# Patient Record
Sex: Female | Born: 1975 | Race: White | Hispanic: No | Marital: Married | Smoking: Never smoker
Health system: Southern US, Community
[De-identification: ages and names within clinical notes are randomized; demographics above are authoritative.]

## PROBLEM LIST (undated history)

## (undated) DIAGNOSIS — I4891 Unspecified atrial fibrillation: Secondary | ICD-10-CM

## (undated) DIAGNOSIS — F419 Anxiety disorder, unspecified: Secondary | ICD-10-CM

## (undated) DIAGNOSIS — G43909 Migraine, unspecified, not intractable, without status migrainosus: Secondary | ICD-10-CM

## (undated) DIAGNOSIS — N12 Tubulo-interstitial nephritis, not specified as acute or chronic: Secondary | ICD-10-CM

## (undated) DIAGNOSIS — F32A Depression, unspecified: Secondary | ICD-10-CM

## (undated) DIAGNOSIS — N289 Disorder of kidney and ureter, unspecified: Secondary | ICD-10-CM

## (undated) HISTORY — PX: ABDOMINAL HYSTERECTOMY: SHX81

## (undated) HISTORY — DX: Depression, unspecified: F32.A

## (undated) HISTORY — PX: BLADDER SUSPENSION: SHX72

## (undated) HISTORY — DX: Anxiety disorder, unspecified: F41.9

## (undated) HISTORY — PX: TONSILLECTOMY: SUR1361

---

## 2004-04-17 ENCOUNTER — Emergency Department: Payer: Self-pay | Admitting: Emergency Medicine

## 2004-07-03 ENCOUNTER — Emergency Department: Payer: Self-pay | Admitting: Internal Medicine

## 2005-02-23 ENCOUNTER — Emergency Department: Payer: Self-pay | Admitting: Emergency Medicine

## 2006-02-27 ENCOUNTER — Emergency Department: Payer: Self-pay | Admitting: Emergency Medicine

## 2006-09-10 ENCOUNTER — Emergency Department: Payer: Self-pay | Admitting: Emergency Medicine

## 2007-03-02 ENCOUNTER — Emergency Department: Payer: Self-pay | Admitting: Emergency Medicine

## 2007-06-14 ENCOUNTER — Emergency Department: Payer: Self-pay | Admitting: Emergency Medicine

## 2007-07-26 ENCOUNTER — Other Ambulatory Visit: Payer: Self-pay

## 2007-07-26 ENCOUNTER — Emergency Department: Payer: Self-pay | Admitting: Internal Medicine

## 2009-09-15 ENCOUNTER — Emergency Department: Payer: Self-pay | Admitting: Emergency Medicine

## 2011-01-11 ENCOUNTER — Emergency Department: Payer: Self-pay | Admitting: Unknown Physician Specialty

## 2011-08-28 ENCOUNTER — Emergency Department: Payer: Self-pay | Admitting: Emergency Medicine

## 2011-08-28 LAB — COMPREHENSIVE METABOLIC PANEL
Albumin: 3.7 g/dL (ref 3.4–5.0)
Anion Gap: 6 — ABNORMAL LOW (ref 7–16)
BUN: 8 mg/dL (ref 7–18)
Bilirubin,Total: 0.6 mg/dL (ref 0.2–1.0)
Calcium, Total: 8.9 mg/dL (ref 8.5–10.1)
Chloride: 106 mmol/L (ref 98–107)
Co2: 29 mmol/L (ref 21–32)
EGFR (African American): >60
Glucose: 90 mg/dL (ref 65–99)
Osmolality: 279 (ref 275–301)
Potassium: 4 mmol/L (ref 3.5–5.1)
SGOT(AST): 25 U/L (ref 15–37)
Sodium: 141 mmol/L (ref 136–145)
Total Protein: 7.2 g/dL (ref 6.4–8.2)

## 2011-08-28 LAB — URINALYSIS, COMPLETE
Bilirubin,UR: NEGATIVE
Glucose,UR: NEGATIVE mg/dL (ref 0–75)
Ph: 6 (ref 4.5–8.0)
Protein: NEGATIVE
RBC,UR: NONE SEEN /HPF (ref 0–5)
Specific Gravity: 1.011 (ref 1.003–1.030)
Squamous Epithelial: 7
WBC UR: 1 /HPF (ref 0–5)

## 2011-08-28 LAB — CBC
HCT: 44.1 % (ref 35.0–47.0)
HGB: 15.1 g/dL (ref 12.0–16.0)
MCH: 30 pg (ref 26.0–34.0)
MCHC: 34.1 g/dL (ref 32.0–36.0)
MCV: 88 fL (ref 80–100)
RBC: 5.01 10*6/uL (ref 3.80–5.20)
RDW: 12.6 % (ref 11.5–14.5)

## 2011-08-28 LAB — WET PREP, GENITAL

## 2011-10-24 ENCOUNTER — Emergency Department: Payer: Self-pay | Admitting: Emergency Medicine

## 2012-02-23 ENCOUNTER — Emergency Department: Payer: Self-pay | Admitting: Emergency Medicine

## 2012-02-23 LAB — CBC
HGB: 14.7 g/dL (ref 12.0–16.0)
MCHC: 35.5 g/dL (ref 32.0–36.0)
MCV: 86 fL (ref 80–100)
RBC: 4.83 10*6/uL (ref 3.80–5.20)
RDW: 12.6 % (ref 11.5–14.5)

## 2012-02-23 LAB — COMPREHENSIVE METABOLIC PANEL
Alkaline Phosphatase: 106 U/L (ref 50–136)
Anion Gap: 8 (ref 7–16)
BUN: 7 mg/dL (ref 7–18)
Calcium, Total: 8.6 mg/dL (ref 8.5–10.1)
Chloride: 110 mmol/L — ABNORMAL HIGH (ref 98–107)
EGFR (African American): >60
SGOT(AST): 19 U/L (ref 15–37)
SGPT (ALT): 38 U/L (ref 12–78)
Sodium: 141 mmol/L (ref 136–145)
Total Protein: 7 g/dL (ref 6.4–8.2)

## 2012-02-23 LAB — CK TOTAL AND CKMB (NOT AT ARMC): CK, Total: 25 U/L (ref 21–215)

## 2012-02-24 LAB — TROPONIN I: Troponin-I: <0.02 ng/mL

## 2012-02-24 LAB — TSH: Thyroid Stimulating Horm: 1.8 u[IU]/mL

## 2012-02-24 LAB — CK TOTAL AND CKMB (NOT AT ARMC): CK-MB: <0.5 ng/mL — ABNORMAL LOW (ref 0.5–3.6)

## 2012-05-30 ENCOUNTER — Emergency Department: Payer: Self-pay | Admitting: Emergency Medicine

## 2012-09-10 ENCOUNTER — Emergency Department: Payer: Self-pay | Admitting: Emergency Medicine

## 2012-11-06 ENCOUNTER — Emergency Department: Payer: Self-pay | Admitting: Emergency Medicine

## 2012-11-06 LAB — CBC
HCT: 44.5 % (ref 35.0–47.0)
HGB: 15.5 g/dL (ref 12.0–16.0)
MCH: 30.3 pg (ref 26.0–34.0)
MCHC: 34.9 g/dL (ref 32.0–36.0)
MCV: 87 fL (ref 80–100)
Platelet: 245 10*3/uL (ref 150–440)
RDW: 12.3 % (ref 11.5–14.5)

## 2012-11-06 LAB — BASIC METABOLIC PANEL
Anion Gap: 5 — ABNORMAL LOW (ref 7–16)
BUN: 10 mg/dL (ref 7–18)
Calcium, Total: 9.2 mg/dL (ref 8.5–10.1)
Chloride: 106 mmol/L (ref 98–107)
Co2: 27 mmol/L (ref 21–32)
Creatinine: 0.82 mg/dL (ref 0.60–1.30)
EGFR (African American): >60
EGFR (Non-African Amer.): >60
Glucose: 85 mg/dL (ref 65–99)
Osmolality: 274 (ref 275–301)
Potassium: 4.1 mmol/L (ref 3.5–5.1)
Sodium: 138 mmol/L (ref 136–145)

## 2012-11-06 LAB — CK TOTAL AND CKMB (NOT AT ARMC): CK, Total: 39 U/L (ref 21–215)

## 2012-11-06 LAB — TROPONIN I: Troponin-I: <0.02 ng/mL

## 2012-11-09 ENCOUNTER — Emergency Department: Payer: Self-pay | Admitting: Emergency Medicine

## 2013-08-24 ENCOUNTER — Emergency Department: Payer: Self-pay | Admitting: Emergency Medicine

## 2013-09-30 ENCOUNTER — Emergency Department: Payer: Self-pay | Admitting: Emergency Medicine

## 2013-09-30 LAB — BASIC METABOLIC PANEL
Anion Gap: 10 (ref 7–16)
BUN: 11 mg/dL (ref 7–18)
Calcium, Total: 8.5 mg/dL (ref 8.5–10.1)
Chloride: 110 mmol/L — ABNORMAL HIGH (ref 98–107)
Co2: 23 mmol/L (ref 21–32)
Creatinine: 0.84 mg/dL (ref 0.60–1.30)
GLUCOSE: 94 mg/dL (ref 65–99)
OSMOLALITY: 284 (ref 275–301)
Potassium: 3.4 mmol/L — ABNORMAL LOW (ref 3.5–5.1)
SODIUM: 143 mmol/L (ref 136–145)

## 2013-09-30 LAB — CBC
HCT: 41.9 % (ref 35.0–47.0)
HGB: 14.4 g/dL (ref 12.0–16.0)
MCH: 30.2 pg (ref 26.0–34.0)
MCHC: 34.4 g/dL (ref 32.0–36.0)
MCV: 88 fL (ref 80–100)
Platelet: 257 10*3/uL (ref 150–440)
RBC: 4.78 10*6/uL (ref 3.80–5.20)
RDW: 11.8 % (ref 11.5–14.5)
WBC: 10.2 10*3/uL (ref 3.6–11.0)

## 2013-09-30 LAB — TROPONIN I: Troponin-I: <0.02 ng/mL

## 2013-12-16 ENCOUNTER — Emergency Department: Payer: Self-pay | Admitting: Internal Medicine

## 2014-04-06 ENCOUNTER — Emergency Department: Payer: Self-pay | Admitting: Emergency Medicine

## 2014-06-16 ENCOUNTER — Emergency Department: Admit: 2014-06-16 | Discharge status: Against Medical Advice | Payer: Self-pay | Admitting: Emergency Medicine

## 2014-09-14 IMAGING — CR DG CHEST 2V
1 series · 2 of 2 positions shown · non-contrast
Comparison: Chest radiograph performed 02/23/2012

CLINICAL DATA: Left-sided chest pain, radiating to the left arm.
Shortness of breath.

EXAM:
CHEST  2 VIEW

[Series 1: w chest pa · 0.14mm/px · 2 of 2 slices shown]
[im 1/2]
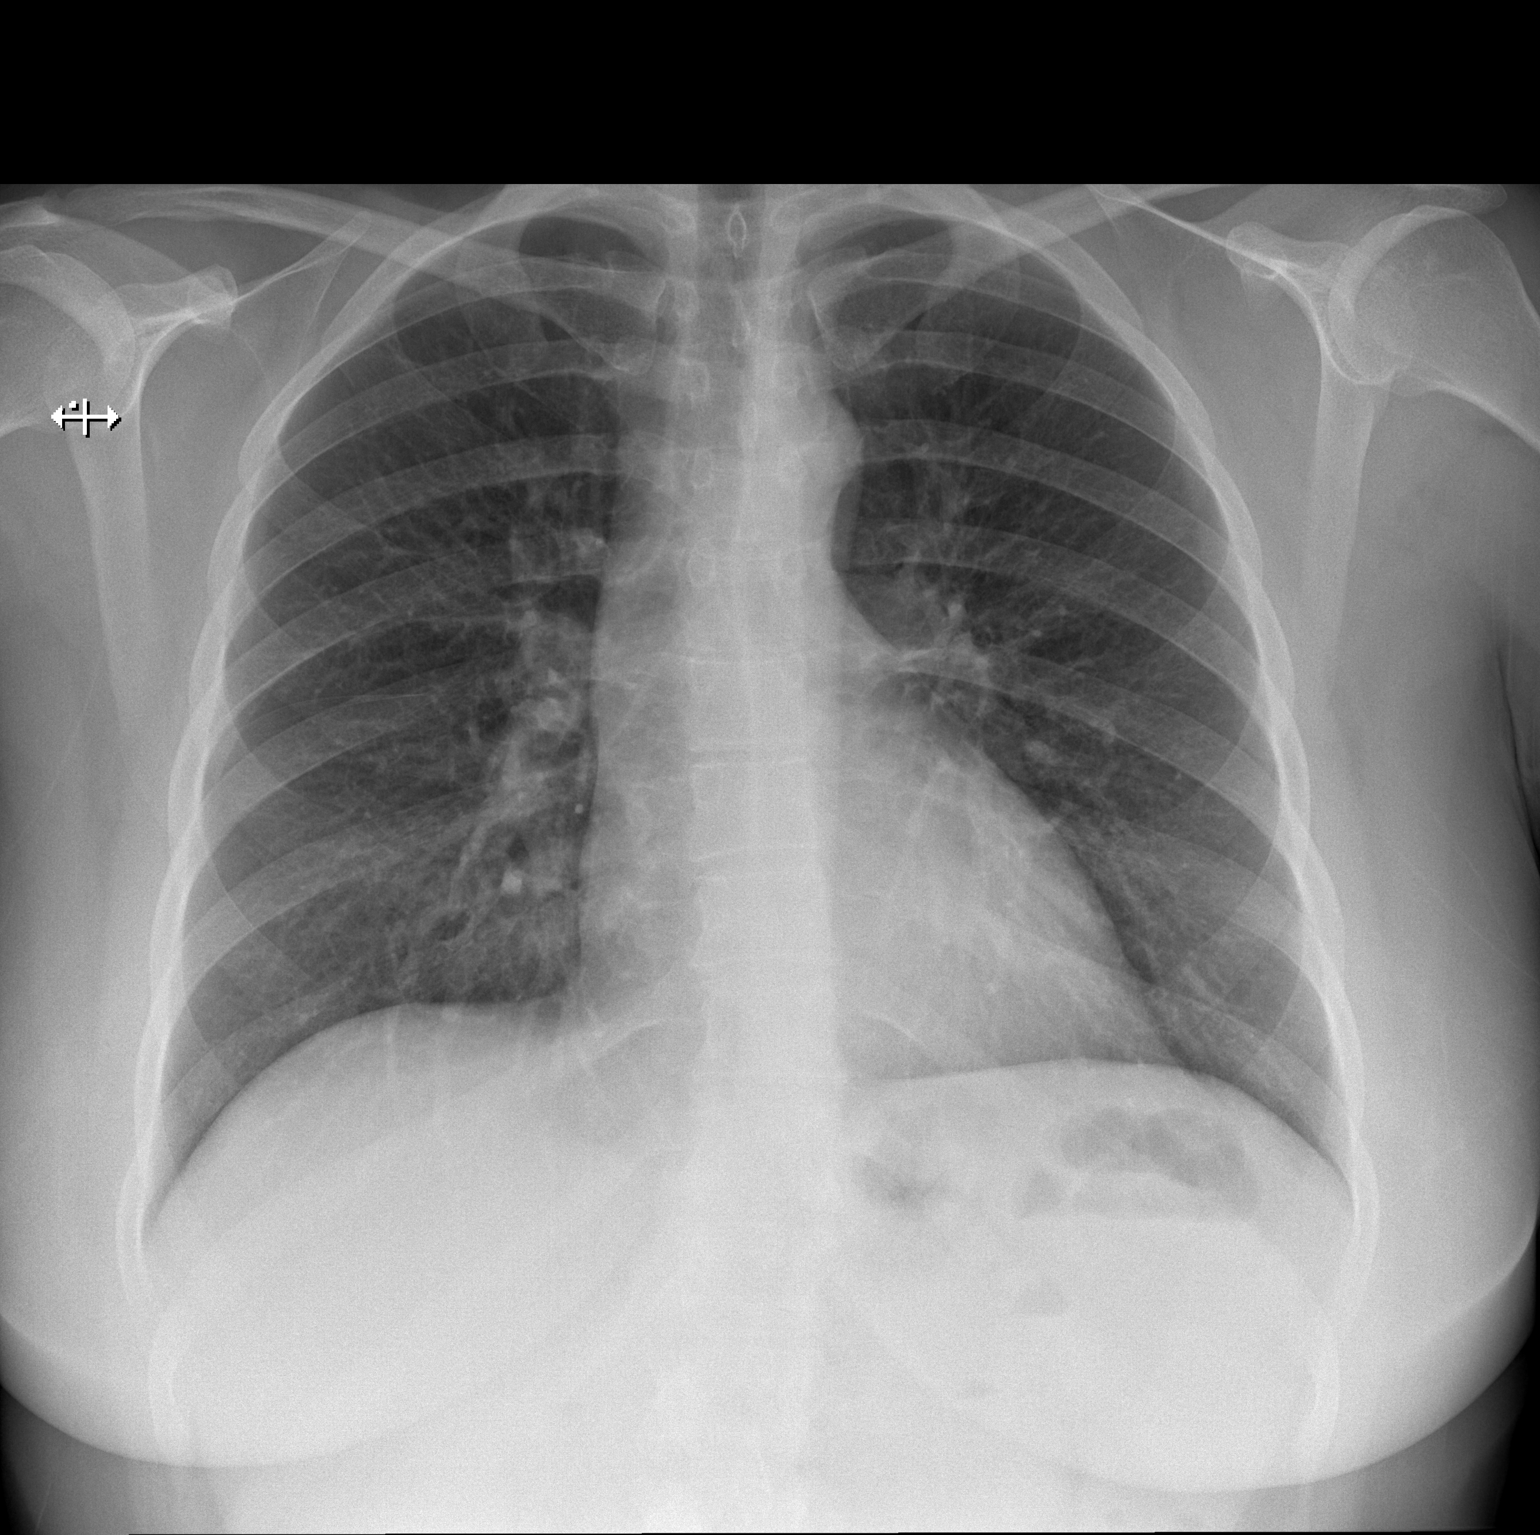
[im 2/2]
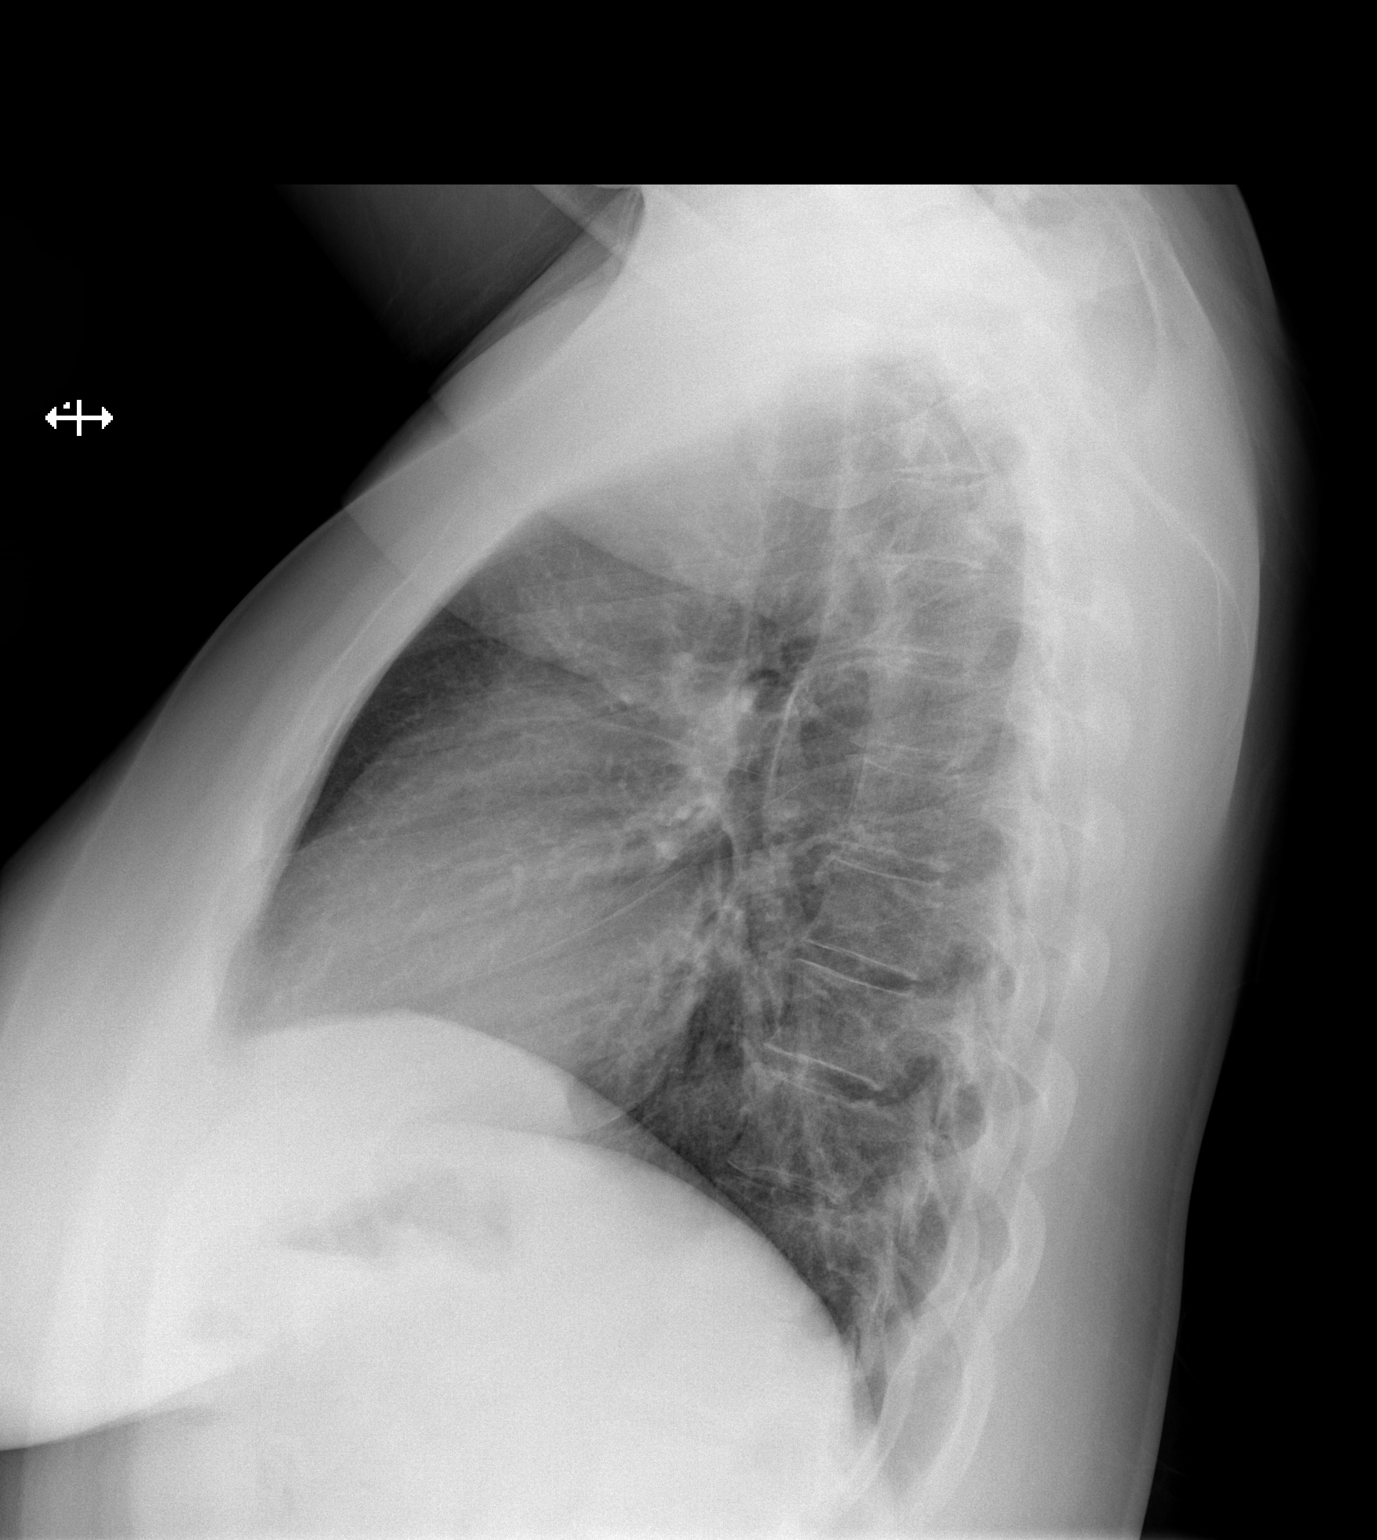

[2 of 2 positions shown; findings below may reference images not displayed]

FINDINGS: The lungs are well-aerated and clear. There is no evidence of focal
opacification, pleural effusion or pneumothorax.

The heart is normal in size; the mediastinal contour is within
normal limits. No acute osseous abnormalities are seen.
IMPRESSION: No acute cardiopulmonary process seen.

## 2015-02-26 ENCOUNTER — Other Ambulatory Visit: Payer: Self-pay

## 2015-02-26 ENCOUNTER — Emergency Department
Admission: EM | Admit: 2015-02-26 | Discharge: 2015-02-26 | Disposition: A | Discharge status: Home / Self Care | Payer: Self-pay | Attending: Emergency Medicine | Admitting: Emergency Medicine

## 2015-02-26 ENCOUNTER — Emergency Department: Payer: Self-pay

## 2015-02-26 ENCOUNTER — Encounter: Payer: Self-pay | Admitting: Emergency Medicine

## 2015-02-26 DIAGNOSIS — R079 Chest pain, unspecified: Secondary | ICD-10-CM | POA: Insufficient documentation

## 2015-02-26 DIAGNOSIS — R42 Dizziness and giddiness: Secondary | ICD-10-CM | POA: Insufficient documentation

## 2015-02-26 DIAGNOSIS — R030 Elevated blood-pressure reading, without diagnosis of hypertension: Secondary | ICD-10-CM | POA: Insufficient documentation

## 2015-02-26 DIAGNOSIS — R51 Headache: Secondary | ICD-10-CM | POA: Insufficient documentation

## 2015-02-26 LAB — CBC
HCT: 42.1 % (ref 35.0–47.0)
Hemoglobin: 14 g/dL (ref 12.0–16.0)
MCH: 29.4 pg (ref 26.0–34.0)
MCHC: 33.1 g/dL (ref 32.0–36.0)
MCV: 88.7 fL (ref 80.0–100.0)
PLATELETS: 282 10*3/uL (ref 150–440)
RBC: 4.75 MIL/uL (ref 3.80–5.20)
RDW: 12.6 % (ref 11.5–14.5)
WBC: 8.5 10*3/uL (ref 3.6–11.0)

## 2015-02-26 LAB — BASIC METABOLIC PANEL
Anion gap: 6 (ref 5–15)
BUN: 13 mg/dL (ref 6–20)
CALCIUM: 9 mg/dL (ref 8.9–10.3)
CHLORIDE: 113 mmol/L — AB (ref 101–111)
CO2: 19 mmol/L — AB (ref 22–32)
Creatinine, Ser: 0.81 mg/dL (ref 0.44–1.00)
GFR calc Af Amer: >60 mL/min (ref >60)
GFR calc non Af Amer: >60 mL/min (ref >60)
Glucose, Bld: 120 mg/dL — ABNORMAL HIGH (ref 65–99)
Potassium: 4.2 mmol/L (ref 3.5–5.1)
Sodium: 138 mmol/L (ref 135–145)

## 2015-02-26 LAB — URINALYSIS COMPLETE WITH MICROSCOPIC (ARMC ONLY)
Bilirubin Urine: NEGATIVE
Glucose, UA: NEGATIVE mg/dL
Hgb urine dipstick: NEGATIVE
KETONES UR: NEGATIVE mg/dL
Leukocytes, UA: NEGATIVE
Nitrite: NEGATIVE
PH: 5 (ref 5.0–8.0)
PROTEIN: NEGATIVE mg/dL
Specific Gravity, Urine: 1.021 (ref 1.005–1.030)

## 2015-02-26 LAB — TROPONIN I: Troponin I: <0.03 ng/mL (ref <0.031)

## 2015-02-26 MED ORDER — ATENOLOL 25 MG PO TABS
25.0000 mg | ORAL_TABLET | Freq: Once | ORAL | Status: AC
  Administered 2015-02-26: 25 mg via ORAL
  Filled 2015-02-26 (×2): qty 1

## 2015-02-26 MED ORDER — ASPIRIN 81 MG PO CHEW
324.0000 mg | CHEWABLE_TABLET | Freq: Once | ORAL | Status: AC
  Administered 2015-02-26: 324 mg via ORAL
  Filled 2015-02-26: qty 4
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
## 2015-02-26 NOTE — Discharge Instructions (Signed)
Follow-up with Dr. Stark Klein - call tomorrow morning for an appointment tomorrow or on Friday. Return to the emergency department if you have further chest pain, if you have shortness of breath, or if you have other urgent concerns.  Nonspecific Chest Pain It is often hard to find the cause of chest pain. There is always a chance that your pain could be related to something serious, such as a heart attack or a blood clot in your lungs. Chest pain can also be caused by conditions that are not life-threatening. If you have chest pain, it is very important to follow up with your doctor.  HOME CARE  If you were prescribed an antibiotic medicine, finish it all even if you start to feel better.  Avoid any activities that cause chest pain.  Do not use any tobacco products, including cigarettes, chewing tobacco, or electronic cigarettes. If you need help quitting, ask your doctor.  Do not drink alcohol.  Take medicines only as told by your doctor.  Keep all follow-up visits as told by your doctor. This is important. This includes any further testing if your chest pain does not go away.  Your doctor may tell you to keep your head raised (elevated) while you sleep.  Make lifestyle changes as told by your doctor. These may include:  Getting regular exercise. Ask your doctor to suggest some activities that are safe for you.  Eating a heart-healthy diet. Your doctor or a diet specialist (dietitian) can help you to learn healthy eating options.  Maintaining a healthy weight.  Managing diabetes, if necessary.  Reducing stress. GET HELP IF:  Your chest pain does not go away, even after treatment.  You have a rash with blisters on your chest.  You have a fever. GET HELP RIGHT AWAY IF:  Your chest pain is worse.  You have an increasing cough, or you cough up blood.  You have severe belly (abdominal) pain.  You feel extremely weak.  You pass out (faint).  You have chills.  You  have sudden, unexplained chest discomfort.  You have sudden, unexplained discomfort in your arms, back, neck, or jaw.  You have shortness of breath at any time.  You suddenly start to sweat, or your skin gets clammy.  You feel nauseous.  You vomit.  You suddenly feel light-headed or dizzy.  Your heart begins to beat quickly, or it feels like it is skipping beats. These symptoms may be an emergency. Do not wait to see if the symptoms will go away. Get medical help right away. Call your local emergency services (911 in the [place] Do not drive yourself to the hospital.   This information is not intended to replace advice given to you by your health care provider. Make sure you discuss any questions you have with your health care provider.   Document Released: 07/28/2007 Document Revised: 03/01/2014 Document Reviewed: 09/14/2013 Elsevier Interactive Patient Education [employer].

## 2015-02-26 NOTE — ED Notes (Signed)
Pt presents with some dizziness and chest pain started this am when she woke up, got worse when she got to work.

## 2015-02-26 NOTE — ED Notes (Signed)
Says she has been having some chest pain on and off.  Today had tingling sensation in left arm.  Left work and went to [facility] to get bp and pulse checked.  160/100 hr 116.

## 2015-12-10 ENCOUNTER — Encounter: Payer: Self-pay | Admitting: *Deleted

## 2015-12-10 ENCOUNTER — Ambulatory Visit: Payer: Self-pay | Attending: Oncology | Admitting: *Deleted

## 2015-12-10 ENCOUNTER — Encounter (INDEPENDENT_AMBULATORY_CARE_PROVIDER_SITE_OTHER): Payer: Self-pay

## 2015-12-10 ENCOUNTER — Ambulatory Visit
Admission: RE | Admit: 2015-12-10 | Discharge: 2015-12-10 | Disposition: A | Discharge status: Home / Self Care | Payer: Self-pay | Source: Ambulatory Visit | Attending: Oncology | Admitting: Oncology

## 2015-12-10 VITALS — BP 116/82 | HR 94 | Temp 98.5°F | Ht 60.63 in | Wt 181.0 lb

## 2015-12-10 DIAGNOSIS — N6459 Other signs and symptoms in breast: Secondary | ICD-10-CM

## 2015-12-10 NOTE — Patient Instructions (Signed)
Gave patient hand-out, Women Staying Healthy, Active and Well from [place], with education on breast health, pap smears, heart and colon health.

## 2015-12-10 NOTE — Progress Notes (Signed)
Subjective:     Patient ID: Allison Sherman, female   DOB: 1976-02-21, 40 y.o.   MRN: [id number]  HPI   Review of Systems     Objective:   Physical Exam  Pulmonary/Chest: Right breast exhibits nipple discharge. Right breast exhibits no inverted nipple, no mass, no skin change and no tenderness. Left breast exhibits mass and nipple discharge. Left breast exhibits no inverted nipple, no skin change and no tenderness. Breasts are symmetrical.    Patient states she has had milky bilateral nipple discharge on manipulation only for over 15 years       Assessment:     40 year old White female presents to [facility] with complaints of a left breast mass for over a year.  States the area has doubled in size over the last year and remains tender.  Also states she has a 15 plus year history of white nipple discharge on manipulation only.  On clinical breast exam I can palpate with the assistance the patient a tender mobile approximate 3X4 cm  thickening from 6-9:00 left medial breast near the sternum.  No nipple discharge noted on exam.  Taught self breast awareness.  Patient has been screened for eligibility.  She does not have any insurance, Medicare or Medicaid.  She also meets financial eligibility.  Hand-out given on the Affordable Care Act.    Plan:     Will get bilateral diagnostic mammogram and ultrasound.  Will follow-up per [facility] protocol.

## 2015-12-16 ENCOUNTER — Telehealth: Payer: Self-pay | Admitting: *Deleted

## 2015-12-16 ENCOUNTER — Encounter: Payer: Self-pay | Admitting: General Surgery

## 2015-12-16 NOTE — Telephone Encounter (Signed)
Called patient and discussed her results of her mammogram.  Offered a surgical consult.  Patient is scheduled to see Dr. Jamal Collin on 12/25/15 at 2:15.  She is to arrive 30 minutes early to complete paperwork.  She is to take a photo ID and all her meds with her to the appointment.  Will follow up per [facility] protocol.

## 2015-12-24 ENCOUNTER — Encounter: Payer: Self-pay | Admitting: *Deleted

## 2015-12-25 ENCOUNTER — Ambulatory Visit: Payer: Self-pay | Admitting: General Surgery

## 2016-01-06 ENCOUNTER — Telehealth: Payer: Self-pay | Admitting: *Deleted
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
## 2016-01-29 ENCOUNTER — Telehealth: Payer: Self-pay | Admitting: *Deleted

## 2016-01-29 ENCOUNTER — Encounter: Payer: Self-pay | Admitting: *Deleted

## 2016-01-29 NOTE — Telephone Encounter (Signed)
Attempted to call patient, but she has no voicemail available to leave a message.  She has not rescheduled her appointment with Dr. Jamal Collin.  Mailed letter to offer to reschedule.  If no response will close case as refusal to follow-up.

## 2016-02-05 ENCOUNTER — Encounter: Payer: Self-pay | Admitting: *Deleted

## 2016-02-05 NOTE — Progress Notes (Signed)
Patient has never rescheduled her appointment for surgical consult.  Will close case as refusal to follow-up.  HSIS to [place].

## 2016-02-19 ENCOUNTER — Encounter: Payer: Self-pay | Admitting: *Deleted

## 2016-08-21 ENCOUNTER — Emergency Department: Payer: Self-pay

## 2016-08-21 ENCOUNTER — Encounter: Payer: Self-pay | Admitting: Emergency Medicine

## 2016-08-21 ENCOUNTER — Emergency Department
Admission: EM | Admit: 2016-08-21 | Discharge: 2016-08-21 | Disposition: A | Discharge status: Home / Self Care | Payer: Self-pay | Attending: Emergency Medicine | Admitting: Emergency Medicine

## 2016-08-21 DIAGNOSIS — K529 Noninfective gastroenteritis and colitis, unspecified: Secondary | ICD-10-CM | POA: Insufficient documentation

## 2016-08-21 HISTORY — DX: Disorder of kidney and ureter, unspecified: N28.9

## 2016-08-21 HISTORY — DX: Unspecified atrial fibrillation: I48.91

## 2016-08-21 LAB — URINALYSIS, COMPLETE (UACMP) WITH MICROSCOPIC
Bilirubin Urine: NEGATIVE
GLUCOSE, UA: NEGATIVE mg/dL
HGB URINE DIPSTICK: NEGATIVE
KETONES UR: NEGATIVE mg/dL
LEUKOCYTES UA: NEGATIVE
NITRITE: NEGATIVE
PROTEIN: NEGATIVE mg/dL
Specific Gravity, Urine: 1.013 (ref 1.005–1.030)
pH: 7 (ref 5.0–8.0)

## 2016-08-21 LAB — COMPREHENSIVE METABOLIC PANEL
ALT: 36 U/L (ref 14–54)
AST: 29 U/L (ref 15–41)
Albumin: 3.6 g/dL (ref 3.5–5.0)
Alkaline Phosphatase: 79 U/L (ref 38–126)
Anion gap: 6 (ref 5–15)
BUN: 12 mg/dL (ref 6–20)
CHLORIDE: 108 mmol/L (ref 101–111)
CO2: 23 mmol/L (ref 22–32)
Calcium: 9.1 mg/dL (ref 8.9–10.3)
Creatinine, Ser: 0.68 mg/dL (ref 0.44–1.00)
Glucose, Bld: 93 mg/dL (ref 65–99)
POTASSIUM: 3.6 mmol/L (ref 3.5–5.1)
Sodium: 137 mmol/L (ref 135–145)
Total Bilirubin: 0.6 mg/dL (ref 0.3–1.2)
Total Protein: 6.9 g/dL (ref 6.5–8.1)

## 2016-08-21 LAB — CBC
HCT: 42.2 % (ref 35.0–47.0)
HEMOGLOBIN: 14.6 g/dL (ref 12.0–16.0)
MCH: 29.6 pg (ref 26.0–34.0)
MCHC: 34.7 g/dL (ref 32.0–36.0)
MCV: 85.5 fL (ref 80.0–100.0)
Platelets: 292 10*3/uL (ref 150–440)
RBC: 4.94 MIL/uL (ref 3.80–5.20)
RDW: 12.5 % (ref 11.5–14.5)
WBC: 15.5 10*3/uL — AB (ref 3.6–11.0)

## 2016-08-21 LAB — LIPASE, BLOOD: LIPASE: 31 U/L (ref 11–51)

## 2016-08-21 LAB — POCT PREGNANCY, URINE: Preg Test, Ur: NEGATIVE

## 2016-08-21 MED ORDER — CIPROFLOXACIN HCL 500 MG PO TABS
500.0000 mg | ORAL_TABLET | Freq: Once | ORAL | Status: AC
  Administered 2016-08-21: 500 mg via ORAL
  Filled 2016-08-21: qty 1

## 2016-08-21 MED ORDER — ONDANSETRON HCL 4 MG/2ML IJ SOLN
4.0000 mg | Freq: Once | INTRAMUSCULAR | Status: AC
  Administered 2016-08-21: 4 mg via INTRAVENOUS
  Filled 2016-08-21: qty 2

## 2016-08-21 MED ORDER — METRONIDAZOLE 500 MG PO TABS: 500.0000 mg | ORAL_TABLET | Freq: Three times a day (TID) | ORAL | 0 refills | Status: AC

## 2016-08-21 MED ORDER — DICYCLOMINE HCL 20 MG PO TABS: 20.0000 mg | ORAL_TABLET | Freq: Three times a day (TID) | ORAL | 0 refills | Status: DC | PRN

## 2016-08-21 MED ORDER — MORPHINE SULFATE (PF) 2 MG/ML IV SOLN
2.0000 mg | Freq: Once | INTRAVENOUS | Status: AC
  Administered 2016-08-21: 2 mg via INTRAVENOUS
  Filled 2016-08-21: qty 1

## 2016-08-21 MED ORDER — IOPAMIDOL (ISOVUE-300) INJECTION 61%
100.0000 mL | Freq: Once | INTRAVENOUS | Status: AC | PRN
  Administered 2016-08-21: 100 mL via INTRAVENOUS

## 2016-08-21 MED ORDER — SODIUM CHLORIDE 0.9 % IV BOLUS (SEPSIS)
1000.0000 mL | Freq: Once | INTRAVENOUS | Status: AC
  Administered 2016-08-21: 1000 mL via INTRAVENOUS

## 2016-08-21 MED ORDER — CIPROFLOXACIN HCL 500 MG PO TABS: 500.0000 mg | ORAL_TABLET | Freq: Two times a day (BID) | ORAL | 0 refills | Status: AC

## 2016-08-21 MED ORDER — IOPAMIDOL (ISOVUE-300) INJECTION 61%
30.0000 mL | Freq: Once | INTRAVENOUS | Status: AC | PRN
  Administered 2016-08-21: 30 mL via ORAL

## 2016-08-21 MED ORDER — METRONIDAZOLE 500 MG PO TABS
500.0000 mg | ORAL_TABLET | Freq: Once | ORAL | Status: AC
  Administered 2016-08-21: 500 mg via ORAL
  Filled 2016-08-21: qty 1

## 2016-08-21 NOTE — ED Provider Notes (Signed)
[facility] Emergency Department Provider Note  ____________________________________________   First MD Initiated Contact with Patient 08/21/16 1614     (approximate)  I have reviewed the triage vital signs and the nursing notes.   HISTORY  Chief Complaint Abdominal Pain   HPI Allison Sherman is a 41 y.o. female who is presenting to the emergency department today with left lower quadrant abdominal pain. She says that she has had intermittent pain over the past week but the pain has gotten sharp at this time and cramping and more severe. Says the pain is an 8 out of 10. She says the pain is now diffuse and crampy to the abdomen but worse the left lower quadrant. She has also reporting 8 episodes of diarrhea just today. Does not report any blood in her stool. Reports nausea but no vomiting. 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 No history of diverticulitis. History of a partial hysterectomy.No recent travel or antibiotics.   Past Medical History:  Diagnosis Date  . A-fib ([place])   . Renal disorder    kidney failure at 12    There are no active problems to display for this patient.   Past Surgical History:  Procedure Laterality Date  . ABDOMINAL HYSTERECTOMY     partial  . BLADDER SUSPENSION    . TONSILLECTOMY      Prior to Admission medications   Not on File    Allergies Patient has no known allergies.  Family History  Problem Relation Age of Onset  . Breast cancer Paternal Aunt        20's    Social History Social History  Substance Use Topics  . Smoking status: Never Smoker  . Smokeless tobacco: Never Used  . Alcohol use Yes     Comment: occasionally    Review of Systems  Constitutional: No fever/chills Eyes: No visual changes. ENT: No sore throat. Cardiovascular: Denies chest pain. Respiratory: Denies shortness of breath. Gastrointestinal:  no vomiting.  No  constipation. Genitourinary: Negative for dysuria. Musculoskeletal: Negative for back pain. Skin: Negative for rash. Neurological: Negative for headaches, focal weakness or numbness.   ____________________________________________   PHYSICAL EXAM:  VITAL SIGNS: ED Triage Vitals  Enc Vitals Group     BP 08/21/16 1429 127/84     Pulse Rate 08/21/16 1429 (!) 109     Resp 08/21/16 1429 20     Temp 08/21/16 1429 98.9 F (37.2 C)     Temp Source 08/21/16 1429 Oral     SpO2 08/21/16 1429 97 %     Weight 08/21/16 1427 190 lb (86.2 kg)     Height 08/21/16 1427 5' (1.524 m)     Head Circumference --      Peak Flow --      Pain Score 08/21/16 1428 8     Pain Loc --      Pain Edu? --      Excl. in [place]? --     Constitutional: Alert and oriented. Well appearing and in no acute distress. Eyes: Conjunctivae are normal.  Head: Atraumatic. Nose: No congestion/rhinnorhea. Mouth/Throat: Mucous membranes are moist.  Neck: No stridor.   Cardiovascular: Normal rate, regular rhythm. Grossly normal heart sounds.   Respiratory: Normal respiratory effort.  No retractions. Lungs CTAB. Gastrointestinal: Soft With mild to moderate tenderness to left lower quadrant. No rebound or guarding. When I palpate the lower quadrant the patient says that  the pain radiates to the left lower quadrant. No distention.  Musculoskeletal: No lower extremity tenderness nor edema.  No joint effusions. Neurologic:  Normal speech and language. No gross focal neurologic deficits are appreciated. Skin:  Skin is warm, dry and intact. No rash noted. Psychiatric: Mood and affect are normal. Speech and behavior are normal.  ____________________________________________   LABS (all labs ordered are listed, but only abnormal results are displayed)  Labs Reviewed  CBC - Abnormal; Notable for the following:       Result Value   WBC 15.5 (*)    All other components within normal limits  URINALYSIS, COMPLETE (UACMP) WITH  MICROSCOPIC - Abnormal; Notable for the following:    Color, Urine YELLOW (*)    APPearance CLEAR (*)    Bacteria, UA RARE (*)    Squamous Epithelial / LPF 0-5 (*)    All other components within normal limits  GASTROINTESTINAL PANEL BY PCR, STOOL (REPLACES STOOL CULTURE)  C DIFFICILE QUICK SCREEN W PCR REFLEX  LIPASE, BLOOD  COMPREHENSIVE METABOLIC PANEL  POCT PREGNANCY, URINE   ____________________________________________  EKG   ____________________________________________  RADIOLOGY  CT of the abdomen without any evidence of acute abnormality within the solid abdominal organs. Diffuse because of thickening of the cecum and ascending colon part of the transverse colon likely infectious or inflammatory given the long segment of colon involvement. ____________________________________________   PROCEDURES  Procedure(s) performed:   Angiocath insertion Performed by: Doran Stabler  Consent: Verbal consent obtained. Risks and benefits: risks, benefits and alternatives were discussed Time out: Immediately prior to procedure a "time out" was called to verify the correct patient, procedure, equipment, support staff and site/side marked as required.  Preparation: Patient was prepped and draped in the usual sterile fashion.  Vein Location: Right basilic  Ultrasound Guided  Gauge: 18  Normal blood return and flush without difficulty Patient tolerance: Patient tolerated the procedure well with no immediate complications.     Procedures  Critical Care performed:   ____________________________________________   INITIAL IMPRESSION / ASSESSMENT AND PLAN / ED COURSE  Pertinent labs & imaging results that were available during my care of the patient were reviewed by me and considered in my medical decision making (see chart for details).  ----------------------------------------- 7:46 PM on 08/21/2016 -----------------------------------------  Patient without  any episodes of diarrhea in the emergency department. However, possible inflammatory or infectious etiology of the colon found on CAT scan. Patient says that she has not eaten anything and this is when her diarrhea usually starts. I will prescribe her Cipro and Flagyl for presumed colitis as well as Bentyl for abdominal cramping. She'll follow up as an outpatient. She is understanding the plan and willing to comply. We reviewed the labs as well as the imaging. She knows to return to the hospital for any worsening or concerning symptoms.      ____________________________________________   FINAL CLINICAL IMPRESSION(S) / ED DIAGNOSES  Colitis.    NEW MEDICATIONS STARTED DURING THIS VISIT:  New Prescriptions   No medications on file     Note:  This document was prepared using Dragon voice recognition software and may include unintentional dictation errors.     Orbie Pyo, MD 08/21/16 [id number]

## 2016-08-21 NOTE — ED Notes (Addendum)
Patient reports 8 bowel movements today prior to arrival. Patient states they are not watery just loose and soft. Denies seeing any blood in stool. 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 Denies being around anyone with same symptoms.

## 2016-08-21 NOTE — ED Triage Notes (Signed)
Pt presents to ED via POV c/o LLQ 8/10 pain x1 week, worsening the last 2 days. +diarrhea, denies vomiting. Pt states pain is sharp/shooting and has intermittent "contraction-type" spasms.

## 2017-04-06 ENCOUNTER — Other Ambulatory Visit: Payer: Self-pay

## 2017-04-06 ENCOUNTER — Encounter: Payer: Self-pay | Admitting: Emergency Medicine

## 2017-04-06 ENCOUNTER — Emergency Department
Admission: EM | Admit: 2017-04-06 | Discharge: 2017-04-06 | Disposition: A | Discharge status: Home / Self Care | Payer: Self-pay | Attending: Emergency Medicine | Admitting: Emergency Medicine

## 2017-04-06 DIAGNOSIS — H10022 Other mucopurulent conjunctivitis, left eye: Secondary | ICD-10-CM | POA: Insufficient documentation

## 2017-04-06 DIAGNOSIS — H1032 Unspecified acute conjunctivitis, left eye: Secondary | ICD-10-CM

## 2017-04-06 MED ORDER — TOBRAMYCIN 0.3 % OP SOLN: 2.0000 [drp] | OPHTHALMIC | 0 refills | Status: DC

## 2017-04-06 NOTE — Discharge Instructions (Signed)
Follow-up with your regular doctor if you are not better in 3 days.  Use medication as prescribed.  If you are worsening please return to the emergency department.  Wash your hands after touching her eye.

## 2017-04-06 NOTE — ED Triage Notes (Signed)
Pt with left eye redness and drainage with itching.

## 2017-04-06 NOTE — ED Provider Notes (Signed)
[facility] Emergency Department Provider Note  ____________________________________________   First MD Initiated Contact with Patient 04/06/17 [phone]     (approximate)  I have reviewed the triage vital signs and the nursing notes.   HISTORY  Chief Complaint Eye Drainage    HPI Allison Sherman is a 42 y.o. female presents emergency department complaining of left eye redness and drainage.  She states her eyes feel irritated last night when she rinsed it with water.  She woke up this morning with some crusting.  Since then she has had drainage from the eye.  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  She denies fever or chills.  She denies cough or congestion  Past Medical History:  Diagnosis Date  . A-fib ([place])   . Renal disorder    kidney failure at 12    There are no active problems to display for this patient.   Past Surgical History:  Procedure Laterality Date  . ABDOMINAL HYSTERECTOMY     partial  . BLADDER SUSPENSION    . TONSILLECTOMY      Prior to Admission medications   Medication Sig Start Date End Date Taking? Authorizing Provider  dicyclomine (BENTYL) 20 MG tablet Take 1 tablet (20 mg total) by mouth 3 (three) times daily as needed for spasms. 08/21/16 08/21/17  Orbie Pyo, MD  tobramycin (TOBREX) 0.3 % ophthalmic solution Place 2 drops into the left eye every 4 (four) hours. 04/06/17   Versie Starks, PA-C    Allergies Patient has no known allergies.  Family History  Problem Relation Age of Onset  . Breast cancer Paternal Aunt        20's    Social History Social History   Tobacco Use  . Smoking status: Never Smoker  . Smokeless tobacco: Never Used  Substance Use Topics  . Alcohol use: Yes    Comment: occasionally  . Drug use: No    Review of Systems  Constitutional: No fever/chills Eyes: No visual changes.  Positive for redness and drainage ENT: No sore throat. Respiratory: Denies  cough Genitourinary: Negative for dysuria. Musculoskeletal: Negative for back pain. Skin: Negative for rash.    ____________________________________________   PHYSICAL EXAM:  VITAL SIGNS: ED Triage Vitals  Enc Vitals Group     BP 04/06/17 0737 (!) 138/103     Pulse Rate 04/06/17 0735 (!) 103     Resp 04/06/17 0735 20     Temp 04/06/17 0735 98.2 F (36.8 C)     Temp Source 04/06/17 0735 Oral     SpO2 04/06/17 0735 100 %     Weight 04/06/17 0735 190 lb (86.2 kg)     Height --      Head Circumference --      Peak Flow --      Pain Score --      Pain Loc --      Pain Edu? --      Excl. in [place]? --     Constitutional: Alert and oriented. Well appearing and in no acute distress. Eyes: Conjunctiva of the left eye is red, there is active drainage, there is no matting at this time. perrl Head: Atraumatic. Nose: No congestion/rhinnorhea. Mouth/Throat: Mucous membranes are moist.  Throat is normal Neck: Neck is supple no lymphadenopathy is noted Cardiovascular: Normal rate, regular rhythm. Respiratory: Normal respiratory effort.  No retractions, lungs clear to auscultation GU: deferred Musculoskeletal: FROM all extremities, warm and well  perfused Neurologic:  Normal speech and language.  Skin:  Skin is warm, dry and intact. No rash noted. Psychiatric: Mood and affect are normal. Speech and behavior are normal.  ____________________________________________   LABS (all labs ordered are listed, but only abnormal results are displayed)  Labs Reviewed - No data to display ____________________________________________   ____________________________________________  RADIOLOGY    ____________________________________________   PROCEDURES  Procedure(s) performed: No  Procedures    ____________________________________________   INITIAL IMPRESSION / ASSESSMENT AND PLAN / ED COURSE  Pertinent labs & imaging results that were available during my care of the patient  were reviewed by me and considered in my medical decision making (see chart for details).  Patient is 42 year old female complaining of left eye redness and drainage  On physical exam the left eye is injected with some drainage  Diagnosis is acute conjunctivitis.  A prescription for tobramycin ophthalmic drops was provided.  Patient is to apply warm compress as needed.  She is to wash her hands anytime she touches her eye.  She can return to work after she has had 24 hours of medication.  She is to return to the emergency department if she is worsening.  She is to see her regular doctor if she is not better in 3-5 days.  Patient states she understands.  She was given a work note and discharged in stable condition     As part of my medical decision making, I reviewed the following data within the [place] notes reviewed and incorporated, Old chart reviewed, Notes from prior ED visits and [place] Controlled Substance Database  ____________________________________________   FINAL CLINICAL IMPRESSION(S) / ED DIAGNOSES  Final diagnoses:  Acute bacterial conjunctivitis of left eye      NEW MEDICATIONS STARTED DURING THIS VISIT:  New Prescriptions   TOBRAMYCIN (TOBREX) 0.3 % OPHTHALMIC SOLUTION    Place 2 drops into the left eye every 4 (four) hours.     Note:  This document was prepared using Dragon voice recognition software and may include unintentional dictation errors.    Versie Starks, PA-C 04/06/17 [place], [place], MD 04/07/17 [phone]

## 2017-04-06 NOTE — ED Notes (Signed)
FIRST NURSE NOTE: Pt to ER via POV, ambulatory c/o left eye redness and drainage. Pt alert and oriented X4, active, cooperative, pt in NAD. RR even and unlabored, color WNL.

## 2017-05-01 ENCOUNTER — Emergency Department
Admission: EM | Admit: 2017-05-01 | Discharge: 2017-05-01 | Disposition: A | Discharge status: Home / Self Care | Payer: Self-pay | Attending: Emergency Medicine | Admitting: Emergency Medicine

## 2017-05-01 DIAGNOSIS — Z79899 Other long term (current) drug therapy: Secondary | ICD-10-CM | POA: Insufficient documentation

## 2017-05-01 DIAGNOSIS — L03011 Cellulitis of right finger: Secondary | ICD-10-CM | POA: Insufficient documentation

## 2017-05-01 MED ORDER — CEPHALEXIN 500 MG PO CAPS
500.0000 mg | ORAL_CAPSULE | Freq: Four times a day (QID) | ORAL | 0 refills | Status: AC
Start: 2017-05-01 — End: 2017-05-11

## 2017-05-01 NOTE — ED Provider Notes (Signed)
[facility] Emergency Department Provider Note  ____________________________________________  Time seen: Approximately 9:50 PM  I have reviewed the triage vital signs and the nursing notes.   HISTORY  Chief Complaint Hand Pain    HPI Allison Sherman is a 42 y.o. female that presents to the emergency department for evaluation of redness and swelling to nail bed of right index finger for 1 week.  Patient  tried to puncture area with a needle without drainage.  She does not bite her nails are pink her cuticles.  No fever, chills.  Past Medical History:  Diagnosis Date  . A-fib ([place])   . Renal disorder    kidney failure at 12    There are no active problems to display for this patient.   Past Surgical History:  Procedure Laterality Date  . ABDOMINAL HYSTERECTOMY     partial  . BLADDER SUSPENSION    . TONSILLECTOMY      Prior to Admission medications   Medication Sig Start Date End Date Taking? Authorizing Provider  cephALEXin (KEFLEX) 500 MG capsule Take 1 capsule (500 mg total) by mouth 4 (four) times daily for 10 days. 05/01/17 05/11/17  Laban Emperor, PA-C  dicyclomine (BENTYL) 20 MG tablet Take 1 tablet (20 mg total) by mouth 3 (three) times daily as needed for spasms. 08/21/16 08/21/17  Orbie Pyo, MD  tobramycin (TOBREX) 0.3 % ophthalmic solution Place 2 drops into the left eye every 4 (four) hours. 04/06/17   Versie Starks, PA-C    Allergies Patient has no known allergies.  Family History  Problem Relation Age of Onset  . Breast cancer Paternal Aunt        20's    Social History Social History   Tobacco Use  . Smoking status: Never Smoker  . Smokeless tobacco: Never Used  Substance Use Topics  . Alcohol use: Yes    Comment: occasionally  . Drug use: No     Review of Systems  Constitutional: No fever/chills Gastrointestinal: No nausea, no vomiting.  Musculoskeletal: Positive for finger pain. Skin: Negative for  abrasions, lacerations, ecchymosis.  Positive for rash.   ____________________________________________   PHYSICAL EXAM:  VITAL SIGNS: ED Triage Vitals [05/01/17 1938]  Enc Vitals Group     BP (!) 125/94     Pulse Rate 98     Resp 18     Temp 98.7 F (37.1 C)     Temp Source Oral     SpO2 100 %     Weight 190 lb (86.2 kg)     Height 5' (1.524 m)     Head Circumference      Peak Flow      Pain Score 5     Pain Loc      Pain Edu?      Excl. in [place]?      Constitutional: Alert and oriented. Well appearing and in no acute distress. Eyes: Conjunctivae are normal. PERRL. EOMI. Head: Atraumatic. ENT:      Ears:      Nose: No congestion/rhinnorhea.      Mouth/Throat: Mucous membranes are moist.  Neck: No stridor.   Cardiovascular: Normal rate, regular rhythm.  Good peripheral circulation. Respiratory: Normal respiratory effort without tachypnea or retractions. Lungs CTAB. Good air entry to the bases with no decreased or absent breath sounds. Musculoskeletal: Full range of motion to all extremities. No gross deformities appreciated. Neurologic:  Normal speech and language. No gross focal neurologic deficits are  appreciated.  Skin:  Skin is warm, dry and intact.  Redness to right index finger nailbed.  No drainable area noted.  No erythema or tenderness to palpation over finger pad.   ____________________________________________   LABS (all labs ordered are listed, but only abnormal results are displayed)  Labs Reviewed - No data to display ____________________________________________  EKG   ____________________________________________  RADIOLOGY   No results found.  ____________________________________________    PROCEDURES  Procedure(s) performed:    Procedures    Medications - No data to display   ____________________________________________   INITIAL IMPRESSION / ASSESSMENT AND PLAN / ED COURSE  Pertinent labs & imaging results that were  available during my care of the patient were reviewed by me and considered in my medical decision making (see chart for details).  Review of the [place] CSRS was performed in accordance of the [place] prior to dispensing any controlled drugs.   Patient's diagnosis is consistent with paronychia.  Vital signs and exam are reassuring. No drainable abscess.  Patient will be discharged home with prescriptions for keflex. Patient is to follow up with PCP as directed. Patient is given ED precautions to return to the ED for any worsening or new symptoms.     ____________________________________________  FINAL CLINICAL IMPRESSION(S) / ED DIAGNOSES  Final diagnoses:  Paronychia of finger of right hand      NEW MEDICATIONS STARTED DURING THIS VISIT:  ED Discharge Orders        Ordered    cephALEXin (KEFLEX) 500 MG capsule  4 times daily     05/01/17 2153          This chart was dictated using voice recognition software/Dragon. Despite best efforts to proofread, errors can occur which can change the meaning. Any change was purely unintentional.    Laban Emperor, PA-C 05/01/17 [place], [place], MD 05/05/17 1459

## 2017-05-01 NOTE — ED Triage Notes (Addendum)
Patient c/o 2nd digit pain/swelling/redness proximal to nailbed., right hand

## 2017-05-01 NOTE — ED Notes (Signed)
Pt c/o right index finger pain at the fingernail, red, swollen, no drainage. Scab present from where patient used a needle to try to drain it herself. ROM WNL.

## 2017-06-08 ENCOUNTER — Other Ambulatory Visit: Payer: Self-pay

## 2017-06-08 ENCOUNTER — Emergency Department
Admission: EM | Admit: 2017-06-08 | Discharge: 2017-06-08 | Disposition: A | Discharge status: Home / Self Care | Payer: Self-pay | Attending: Emergency Medicine | Admitting: Emergency Medicine

## 2017-06-08 DIAGNOSIS — Z79899 Other long term (current) drug therapy: Secondary | ICD-10-CM | POA: Insufficient documentation

## 2017-06-08 DIAGNOSIS — G43809 Other migraine, not intractable, without status migrainosus: Secondary | ICD-10-CM | POA: Insufficient documentation

## 2017-06-08 MED ORDER — DIPHENHYDRAMINE HCL 50 MG/ML IJ SOLN
50.0000 mg | Freq: Once | INTRAMUSCULAR | Status: AC
  Administered 2017-06-08: 50 mg via INTRAVENOUS

## 2017-06-08 MED ORDER — SODIUM CHLORIDE 0.9 % IV BOLUS
1000.0000 mL | Freq: Once | INTRAVENOUS | Status: AC
  Administered 2017-06-08: 1000 mL via INTRAVENOUS

## 2017-06-08 MED ORDER — KETOROLAC TROMETHAMINE 30 MG/ML IJ SOLN
INTRAMUSCULAR | Status: AC
  Administered 2017-06-08: 30 mg via INTRAVENOUS
  Filled 2017-06-08: qty 1

## 2017-06-08 MED ORDER — METOCLOPRAMIDE HCL 5 MG/ML IJ SOLN
INTRAMUSCULAR | Status: AC
  Administered 2017-06-08: 10 mg via INTRAVENOUS
  Filled 2017-06-08: qty 2

## 2017-06-08 MED ORDER — KETOROLAC TROMETHAMINE 30 MG/ML IJ SOLN
30.0000 mg | Freq: Once | INTRAMUSCULAR | Status: AC
  Administered 2017-06-08: 30 mg via INTRAVENOUS

## 2017-06-08 MED ORDER — METOCLOPRAMIDE HCL 5 MG/ML IJ SOLN
10.0000 mg | Freq: Once | INTRAMUSCULAR | Status: AC
  Administered 2017-06-08: 10 mg via INTRAVENOUS

## 2017-06-08 MED ORDER — DIPHENHYDRAMINE HCL 50 MG/ML IJ SOLN
INTRAMUSCULAR | Status: AC
  Administered 2017-06-08: 50 mg via INTRAVENOUS
  Filled 2017-06-08: qty 1

## 2017-06-08 NOTE — ED Triage Notes (Signed)
FIRST NURSE NOTE-here for migraine. Hx of same. This one X 3 days. excedrin not helping. Ambulatory. NAD

## 2017-06-08 NOTE — ED Provider Notes (Signed)
[facility] Emergency Department Provider Note  Time seen: 10:20 PM  I have reviewed the triage vital signs and the nursing notes.   HISTORY  Chief Complaint Migraine    HPI Allison Sherman is a 42 y.o. female with a past medical history of migraine headaches, presents to the emergency department for headache.  According to the patient she typically gets 2 or 3 migraines per month.  She states for the past 3 days she has had a fairly constant headache.  States last time she had to go to the emergency department for headache was years ago but will on occasion have to.  Denies any weakness or numbness confusion or slurred speech.  Denies any fever.  She does state photo and phonophobia nausea with one episode of vomiting.  Has tried Excedrin Migraine at home which is what she typically takes without significant relief so she came to the emergency department.  Describes a headache as significant 9/10 located just behind her left eye which is typical of her migraines.   Past Medical History:  Diagnosis Date  . A-fib ([place])   . Renal disorder    kidney failure at 12    There are no active problems to display for this patient.   Past Surgical History:  Procedure Laterality Date  . ABDOMINAL HYSTERECTOMY     partial  . BLADDER SUSPENSION    . TONSILLECTOMY      Prior to Admission medications   Medication Sig Start Date End Date Taking? Authorizing Provider  dicyclomine (BENTYL) 20 MG tablet Take 1 tablet (20 mg total) by mouth 3 (three) times daily as needed for spasms. 08/21/16 08/21/17  Orbie Pyo, MD  tobramycin (TOBREX) 0.3 % ophthalmic solution Place 2 drops into the left eye every 4 (four) hours. 04/06/17   Caryn Section Linden Dolin, PA-C    No Known Allergies  Family History  Problem Relation Age of Onset  . Breast cancer Paternal Aunt        20's    Social History Social History   Tobacco Use  . Smoking status: Never Smoker  . Smokeless  tobacco: Never Used  Substance Use Topics  . Alcohol use: Yes    Comment: occasionally  . Drug use: No    Review of Systems Constitutional: Negative for fever. Eyes: Photophobia. ENT: Negative for recent illness/congestion Cardiovascular: Negative for chest pain. Respiratory: Negative for shortness of breath. Gastrointestinal: Negative for abdominal pain.  Intermittent nausea with one episode of vomiting. Genitourinary: Negative for urinary compaints Musculoskeletal: Negative for musculoskeletal complaints Skin: Negative for skin complaints  Neurological: Significant headache.  Denies focal weakness or numbness confusion or slurred speech. All other ROS negative  ____________________________________________   PHYSICAL EXAM:  VITAL SIGNS: ED Triage Vitals  Enc Vitals Group     BP --      Pulse Rate 06/08/17 1825 92     Resp 06/08/17 1825 17     Temp 06/08/17 1825 98.9 F (37.2 C)     Temp Source 06/08/17 1825 Oral     SpO2 06/08/17 1825 98 %     Weight 06/08/17 1826 190 lb (86.2 kg)     Height 06/08/17 1826 5' (1.524 m)     Head Circumference --      Peak Flow --      Pain Score 06/08/17 1826 9     Pain Loc --      Pain Edu? --      Excl.  in [place]? --    Constitutional: Alert and oriented. Well appearing and in no distress. Eyes: Moderate photophobia, 2 mm equal and reactive pupils. ENT   Head: Normocephalic and atraumatic.   Mouth/Throat: Mucous membranes are moist. Cardiovascular: Normal rate, regular rhythm. No murmur Respiratory: Normal respiratory effort without tachypnea nor retractions. Breath sounds are clear Gastrointestinal: Soft and nontender. No distention.   Musculoskeletal: Nontender with normal range of motion in all extremities.  Neurologic:  Normal speech and language. No gross focal neurologic deficits.  Equal grip strengths.  No pronator drift. Skin:  Skin is warm, dry and intact.  Psychiatric: Mood and affect are normal. Speech and behavior  are normal.   ____________________________________________   INITIAL IMPRESSION / ASSESSMENT AND PLAN / ED COURSE  Pertinent labs & imaging results that were available during my care of the patient were reviewed by me and considered in my medical decision making (see chart for details).  Patient presents to the emergency department for left-sided headache with photo and phonophobia as well as nausea typical of her migraines but lasting longer than her migraine typically does.  Differential includes tension headache, migraine headache, cluster headache.  Patient has an intact neurological exam, denies any fever.  No red flags during my examination or history.  Patient treated with Toradol, Reglan, Benadryl and IV fluids.  States significant improvement in the headache, is asking to be discharged home.  I discussed with the patient going home and obtaining plenty of sleep and returning to the emergency department if the headache returns she develops fever or any other personally concerning symptom.  Patient agreeable to plan of care.  ____________________________________________   FINAL CLINICAL IMPRESSION(S) / ED DIAGNOSES  Migraine headache    Harvest Dark, MD 06/08/17 2223

## 2017-06-08 NOTE — ED Triage Notes (Signed)
Pt c/o migraine for the past 3 days, states she normally takes excedrin migraine and gets relief but known today, states she had vomiting earlier today.Marland Kitchen

## 2017-06-08 NOTE — ED Notes (Signed)

## 2017-06-08 NOTE — ED Notes (Signed)
Pt has a headache for 3 days.  Pt taking excedrin without pain relief.  Vomited x 1 today.  Pt alert.  Speech clear.

## 2017-06-14 ENCOUNTER — Emergency Department
Admission: EM | Admit: 2017-06-14 | Discharge: 2017-06-14 | Disposition: A | Discharge status: Home / Self Care | Payer: Self-pay | Attending: Student in an Organized Health Care Education/Training Program | Admitting: Student in an Organized Health Care Education/Training Program

## 2017-06-14 ENCOUNTER — Other Ambulatory Visit: Payer: Self-pay

## 2017-06-14 ENCOUNTER — Emergency Department: Payer: Self-pay

## 2017-06-14 ENCOUNTER — Encounter: Payer: Self-pay | Admitting: Emergency Medicine

## 2017-06-14 DIAGNOSIS — Z79899 Other long term (current) drug therapy: Secondary | ICD-10-CM | POA: Insufficient documentation

## 2017-06-14 DIAGNOSIS — R197 Diarrhea, unspecified: Secondary | ICD-10-CM | POA: Insufficient documentation

## 2017-06-14 LAB — URINALYSIS, COMPLETE (UACMP) WITH MICROSCOPIC
BILIRUBIN URINE: NEGATIVE
Glucose, UA: NEGATIVE mg/dL
HGB URINE DIPSTICK: NEGATIVE
KETONES UR: NEGATIVE mg/dL
LEUKOCYTES UA: NEGATIVE
Nitrite: NEGATIVE
PH: 5 (ref 5.0–8.0)
Protein, ur: NEGATIVE mg/dL
SPECIFIC GRAVITY, URINE: 1.01 (ref 1.005–1.030)

## 2017-06-14 LAB — CBC
HCT: 46.7 % (ref 35.0–47.0)
HEMOGLOBIN: 16.1 g/dL — AB (ref 12.0–16.0)
MCH: 30.1 pg (ref 26.0–34.0)
MCHC: 34.4 g/dL (ref 32.0–36.0)
MCV: 87.3 fL (ref 80.0–100.0)
PLATELETS: 329 10*3/uL (ref 150–440)
RBC: 5.36 MIL/uL — ABNORMAL HIGH (ref 3.80–5.20)
RDW: 12.8 % (ref 11.5–14.5)
WBC: 13.1 10*3/uL — AB (ref 3.6–11.0)

## 2017-06-14 LAB — COMPREHENSIVE METABOLIC PANEL
ALT: 31 U/L (ref 14–54)
AST: 36 U/L (ref 15–41)
Albumin: 4.3 g/dL (ref 3.5–5.0)
Alkaline Phosphatase: 87 U/L (ref 38–126)
Anion gap: 8 (ref 5–15)
BUN: 12 mg/dL (ref 6–20)
CHLORIDE: 108 mmol/L (ref 101–111)
CO2: 19 mmol/L — AB (ref 22–32)
CREATININE: 0.73 mg/dL (ref 0.44–1.00)
Calcium: 9.6 mg/dL (ref 8.9–10.3)
Glucose, Bld: 99 mg/dL (ref 65–99)
Potassium: 4 mmol/L (ref 3.5–5.1)
SODIUM: 135 mmol/L (ref 135–145)
Total Bilirubin: 0.8 mg/dL (ref 0.3–1.2)
Total Protein: 7.9 g/dL (ref 6.5–8.1)

## 2017-06-14 LAB — LIPASE, BLOOD: LIPASE: 27 U/L (ref 11–51)

## 2017-06-14 MED ORDER — PROMETHAZINE HCL 12.5 MG PO TABS: 12.5000 mg | ORAL_TABLET | Freq: Four times a day (QID) | ORAL | 0 refills | Status: DC | PRN

## 2017-06-14 MED ORDER — MORPHINE SULFATE (PF) 4 MG/ML IV SOLN
4.0000 mg | INTRAVENOUS | Status: DC | PRN
  Administered 2017-06-14: 4 mg via INTRAVENOUS
  Filled 2017-06-14: qty 1

## 2017-06-14 MED ORDER — TRAMADOL HCL 50 MG PO TABS: 50.0000 mg | ORAL_TABLET | Freq: Four times a day (QID) | ORAL | 0 refills | Status: AC | PRN

## 2017-06-14 MED ORDER — SODIUM CHLORIDE 0.9 % IV BOLUS
1000.0000 mL | Freq: Once | INTRAVENOUS | Status: AC
  Administered 2017-06-14: 1000 mL via INTRAVENOUS

## 2017-06-14 MED ORDER — METRONIDAZOLE 500 MG PO TABS: 500.0000 mg | ORAL_TABLET | Freq: Two times a day (BID) | ORAL | 0 refills | Status: AC

## 2017-06-14 MED ORDER — PROMETHAZINE HCL 25 MG/ML IJ SOLN
12.5000 mg | Freq: Four times a day (QID) | INTRAMUSCULAR | Status: DC | PRN
  Administered 2017-06-14: 12.5 mg via INTRAVENOUS
  Filled 2017-06-14: qty 1

## 2017-06-14 MED ORDER — IOPAMIDOL (ISOVUE-300) INJECTION 61%
100.0000 mL | Freq: Once | INTRAVENOUS | Status: AC | PRN
  Administered 2017-06-14: 100 mL via INTRAVENOUS
  Filled 2017-06-14: qty 100

## 2017-06-14 MED ORDER — CIPROFLOXACIN HCL 500 MG PO TABS: 500.0000 mg | ORAL_TABLET | Freq: Two times a day (BID) | ORAL | 0 refills | Status: AC

## 2017-06-14 NOTE — Discharge Instructions (Addendum)
You have been seen in the emergency department for emergency care. It is important that you contact your own doctor, specialist or the closest clinic for follow-up care. Please bring this instruction sheet, all medications and X-ray copies with you when you are seen for follow-up care.  Determining the exact cause for all patients with abdominal pain is extremely difficult in the emergency department. Our primary focus is to rule-out immediate life-threatening diseases. If no immediate source of pain is found the definitive diagnosis frequently needs to be determined over time.Many times your primary care physician can determine the cause by following the symptoms over time. Sometimes, specialist are required such as Gastroenterologists, Gynecologists, Urologists or Surgeons. Please return immediately to the Emergency Department for fever>101, Vomiting or Intractable Pain. You should return to the emergency department or see your primary care provider in 12-24hrs if your pain is no better and sooner if your pain becomes worse.

## 2017-06-14 NOTE — ED Notes (Signed)
Pt c/o watery diarrhea for the past 2.5 days with N/V.Marland Kitchen States she was dx with colitis about a month or so ago and this is the same sx.. Pt is in NAD. States stools are yellow in color.

## 2017-06-14 NOTE — ED Provider Notes (Signed)
[facility] Emergency Department Provider Note    First MD Initiated Contact with Patient 06/14/17 1523     (approximate)  I have reviewed the triage vital signs and the nursing notes.   HISTORY  Chief Complaint Abdominal Pain and Diarrhea    HPI Allison Sherman is a 42 y.o. female no significant past medical history presents with chief complaint of watery diarrhea for the past 3 days associate with nausea no significant emesis.  Has not noted any blood in her stools.  Patient states she was previously diagnosed with colitis 1 year ago with similar symptoms.  No known history of diverticulitis.  States the pain is predominantly on the left side.  No recent travel.  No recent antibiotics.  Previous CT imaging did show diffuse colitis.  No evidence of diverticula.  Patient was given referral to GI but never followed up.  Denies any known history of inflammatory bowel disease.  Past Medical History:  Diagnosis Date  . A-fib ([place])   . Renal disorder    kidney failure at 20   Family History  Problem Relation Age of Onset  . Breast cancer Paternal Aunt        20's   Past Surgical History:  Procedure Laterality Date  . ABDOMINAL HYSTERECTOMY     partial  . BLADDER SUSPENSION    . TONSILLECTOMY     There are no active problems to display for this patient.     Prior to Admission medications   Medication Sig Start Date End Date Taking? Authorizing Provider  ciprofloxacin (CIPRO) 500 MG tablet Take 1 tablet (500 mg total) by mouth 2 (two) times daily for 7 days. 06/14/17 06/21/17  Merlyn Lot, MD  dicyclomine (BENTYL) 20 MG tablet Take 1 tablet (20 mg total) by mouth 3 (three) times daily as needed for spasms. 08/21/16 08/21/17  Schaevitz, Randall An, MD  metroNIDAZOLE (FLAGYL) 500 MG tablet Take 1 tablet (500 mg total) by mouth 2 (two) times daily for 7 days. 06/14/17 06/21/17  Merlyn Lot, MD  promethazine (PHENERGAN) 12.5 MG tablet Take 1  tablet (12.5 mg total) by mouth every 6 (six) hours as needed for nausea or vomiting. 06/14/17   Merlyn Lot, MD  tobramycin (TOBREX) 0.3 % ophthalmic solution Place 2 drops into the left eye every 4 (four) hours. 04/06/17   Fisher, Linden Dolin, PA-C  traMADol (ULTRAM) 50 MG tablet Take 1 tablet (50 mg total) by mouth every 6 (six) hours as needed. 06/14/17 06/14/18  Merlyn Lot, MD    Allergies Patient has no known allergies.    Social History Social History   Tobacco Use  . Smoking status: Never Smoker  . Smokeless tobacco: Never Used  Substance Use Topics  . Alcohol use: Yes    Comment: occasionally  . Drug use: No    Review of Systems Patient denies headaches, rhinorrhea, blurry vision, numbness, shortness of breath, chest pain, edema, cough, abdominal pain, nausea, vomiting, diarrhea, dysuria, fevers, rashes or hallucinations unless otherwise stated above in HPI. ____________________________________________   PHYSICAL EXAM:  VITAL SIGNS: Vitals:   06/14/17 1422 06/14/17 1651  BP: 135/90 132/80  Pulse: (!) 107 80  Resp: 18 17  Temp: 99.3 F (37.4 C)   SpO2: 96% 99%    Constitutional: Alert and oriented. Well appearing and in no acute distress. Eyes: Conjunctivae are normal.  Head: Atraumatic. Nose: No congestion/rhinnorhea. Mouth/Throat: Mucous membranes are moist.   Neck: No stridor. Painless ROM.  Cardiovascular: Normal  rate, regular rhythm. Grossly normal heart sounds.  Good peripheral circulation. Respiratory: Normal respiratory effort.  No retractions. Lungs CTAB. Gastrointestinal: Soft and nontender. No distention. No abdominal bruits. No CVA tenderness. Genitourinary:  Musculoskeletal: No lower extremity tenderness nor edema.  No joint effusions. Neurologic:  Normal speech and language. No gross focal neurologic deficits are appreciated. No facial droop Skin:  Skin is warm, dry and intact. No rash noted. Psychiatric: Mood and affect are normal.  Speech and behavior are normal.  ____________________________________________   LABS (all labs ordered are listed, but only abnormal results are displayed)  Results for orders placed or performed during the hospital encounter of 06/14/17 (from the past 24 hour(s))  Lipase, blood     Status: None   Collection Time: 06/14/17  2:38 PM  Result Value Ref Range   Lipase 27 11 - 51 U/L  Comprehensive metabolic panel     Status: Abnormal   Collection Time: 06/14/17  2:38 PM  Result Value Ref Range   Sodium 135 135 - 145 mmol/L   Potassium 4.0 3.5 - 5.1 mmol/L   Chloride 108 101 - 111 mmol/L   CO2 19 (L) 22 - 32 mmol/L   Glucose, Bld 99 65 - 99 mg/dL   BUN 12 6 - 20 mg/dL   Creatinine, Ser 0.73 0.44 - 1.00 mg/dL   Calcium 9.6 8.9 - 10.3 mg/dL   Total Protein 7.9 6.5 - 8.1 g/dL   Albumin 4.3 3.5 - 5.0 g/dL   AST 36 15 - 41 U/L   ALT 31 14 - 54 U/L   Alkaline Phosphatase 87 38 - 126 U/L   Total Bilirubin 0.8 0.3 - 1.2 mg/dL   GFR calc non Af Amer >60 >60 mL/min   GFR calc Af Amer >60 >60 mL/min   Anion gap 8 5 - 15  CBC     Status: Abnormal   Collection Time: 06/14/17  2:38 PM  Result Value Ref Range   WBC 13.1 (H) 3.6 - 11.0 K/uL   RBC 5.36 (H) 3.80 - 5.20 MIL/uL   Hemoglobin 16.1 (H) 12.0 - 16.0 g/dL   HCT 46.7 35.0 - 47.0 %   MCV 87.3 80.0 - 100.0 fL   MCH 30.1 26.0 - 34.0 pg   MCHC 34.4 32.0 - 36.0 g/dL   RDW 12.8 11.5 - 14.5 %   Platelets 329 150 - 440 K/uL  Urinalysis, Complete w Microscopic     Status: Abnormal   Collection Time: 06/14/17  2:38 PM  Result Value Ref Range   Color, Urine YELLOW (A) YELLOW   APPearance HAZY (A) CLEAR   Specific Gravity, Urine 1.010 1.005 - 1.030   pH 5.0 5.0 - 8.0   Glucose, UA NEGATIVE NEGATIVE mg/dL   Hgb urine dipstick NEGATIVE NEGATIVE   Bilirubin Urine NEGATIVE NEGATIVE   Ketones, ur NEGATIVE NEGATIVE mg/dL   Protein, ur NEGATIVE NEGATIVE mg/dL   Nitrite NEGATIVE NEGATIVE   Leukocytes, UA NEGATIVE NEGATIVE   RBC / HPF 0-5 0 - 5  RBC/hpf   WBC, UA 0-5 0 - 5 WBC/hpf   Bacteria, UA FEW (A) NONE SEEN   Squamous Epithelial / LPF 0-5 0 - 5   Mucus PRESENT    Hyaline Casts, UA PRESENT    ____________________________________________ ____________________________________________  RADIOLOGY   ____________________________________________   PROCEDURES  Procedure(s) performed:  Procedures    Critical Care performed: no ____________________________________________   INITIAL IMPRESSION / ASSESSMENT AND PLAN / ED COURSE  Pertinent labs &  imaging results that were available during my care of the patient were reviewed by me and considered in my medical decision making (see chart for details).  DDX: IBD, infectious colitis, diverticulitis, dehydration, viral illness  Allison Sherman is a 42 y.o. who presents to the ED with symptoms as described above.  Patient in no acute distress but does have some discomfort and mild tachycardia.  Blood work sent for the above differential does show leukocytosis but does appear hemoconcentrated and dehydrated therefore will give IV fluids as well as IV symptomatic management.  Based on her abdominal exam do not feel that emergent CT imaging clinically indicated this is not likely consistent with abscess based on her symptoms.  Will give symptomatic treatment and reassess.  Clinical Course as of Jun 14 1801  Tue Jun 14, 2017  1557 Assessment patient does have significantly elevated leukocytosis.  Given concern for possible IBD and concern for development of abscess with her left-sided abdominal pain will order CT imaging to further characterize that she has not followed up with gastroenterology.   [PR]    Clinical Course User Index [PR] Merlyn Lot, MD     As part of my medical decision making, I reviewed the following data within the [facility] notes reviewed and incorporated, Labs reviewed, notes from prior ED visits and [place] Controlled Substance  Database   ____________________________________________   FINAL CLINICAL IMPRESSION(S) / ED DIAGNOSES  Final diagnoses:  Diarrhea of presumed infectious origin      NEW MEDICATIONS STARTED DURING THIS VISIT:  Discharge Medication List as of 06/14/2017  4:19 PM    START taking these medications   Details  ciprofloxacin (CIPRO) 500 MG tablet Take 1 tablet (500 mg total) by mouth 2 (two) times daily for 7 days., Starting Tue 06/14/2017, Until Tue 06/21/2017, Print    metroNIDAZOLE (FLAGYL) 500 MG tablet Take 1 tablet (500 mg total) by mouth 2 (two) times daily for 7 days., Starting Tue 06/14/2017, Until Tue 06/21/2017, Print    promethazine (PHENERGAN) 12.5 MG tablet Take 1 tablet (12.5 mg total) by mouth every 6 (six) hours as needed for nausea or vomiting., Starting Tue 06/14/2017, Print    traMADol (ULTRAM) 50 MG tablet Take 1 tablet (50 mg total) by mouth every 6 (six) hours as needed., Starting Tue 06/14/2017, Until Wed 06/14/2018, Print         Note:  This document was prepared using Dragon voice recognition software and may include unintentional dictation errors.    Merlyn Lot, MD 06/14/17 [phone]

## 2017-06-14 NOTE — ED Triage Notes (Signed)
Says diarrhea watery x 4 days.  Left side abd pain.

## 2017-09-20 ENCOUNTER — Other Ambulatory Visit: Payer: Self-pay

## 2017-09-20 ENCOUNTER — Ambulatory Visit: Payer: Self-pay | Admitting: Gastroenterology

## 2017-09-20 ENCOUNTER — Encounter: Payer: Self-pay | Admitting: Gastroenterology

## 2018-03-19 ENCOUNTER — Emergency Department
Admission: EM | Admit: 2018-03-19 | Discharge: 2018-03-19 | Disposition: A | Discharge status: Home / Self Care | Payer: Self-pay | Attending: Emergency Medicine | Admitting: Emergency Medicine

## 2018-03-19 ENCOUNTER — Other Ambulatory Visit: Payer: Self-pay

## 2018-03-19 DIAGNOSIS — Y929 Unspecified place or not applicable: Secondary | ICD-10-CM | POA: Insufficient documentation

## 2018-03-19 DIAGNOSIS — L03113 Cellulitis of right upper limb: Secondary | ICD-10-CM | POA: Insufficient documentation

## 2018-03-19 DIAGNOSIS — Y999 Unspecified external cause status: Secondary | ICD-10-CM | POA: Insufficient documentation

## 2018-03-19 DIAGNOSIS — Z79899 Other long term (current) drug therapy: Secondary | ICD-10-CM | POA: Insufficient documentation

## 2018-03-19 DIAGNOSIS — Y939 Activity, unspecified: Secondary | ICD-10-CM | POA: Insufficient documentation

## 2018-03-19 DIAGNOSIS — W57XXXA Bitten or stung by nonvenomous insect and other nonvenomous arthropods, initial encounter: Secondary | ICD-10-CM | POA: Insufficient documentation

## 2018-03-19 MED ORDER — CEPHALEXIN 500 MG PO CAPS: 500.0000 mg | ORAL_CAPSULE | Freq: Three times a day (TID) | ORAL | 0 refills | Status: AC

## 2018-03-19 MED ORDER — SULFAMETHOXAZOLE-TRIMETHOPRIM 800-160 MG PO TABS: 1.0000 | ORAL_TABLET | Freq: Two times a day (BID) | ORAL | 0 refills | Status: AC

## 2018-03-19 NOTE — ED Notes (Signed)
Pt c/o pain at right elbow with swelling and redness, pt reports noticing a "small pimple like bump" at site at 11pm yesterday and has since progressed to now approx 6cm dia, warm and firm skin with what appears to be three 55m indurated centers in various stages of healing

## 2018-03-19 NOTE — ED Provider Notes (Signed)
[facility] Emergency Department Provider Note  ____________________________________________  Time seen: Approximately 8:15 PM  I have reviewed the triage vital signs and the nursing notes.   HISTORY  Chief Complaint Insect Bite    HPI Allison Sherman is a 43 y.o. female presents to the emergency department with approximately 4 cm of circumferential cellulitis along the proximal right forearm.  Patient reports that she had a small region of erythema that had overlying blister formation that became apparent this morning.  Patient reports that redness seems to have worsened.  She denies fever and chills.  She denies a history of diabetes.  Patient has never had a MRSA infection in the past.  She has no pain with movement of the right elbow.  No other alleviating measures have been attempted.   Past Medical History:  Diagnosis Date  . A-fib ([place])   . Renal disorder    kidney failure at 12    There are no active problems to display for this patient.   Past Surgical History:  Procedure Laterality Date  . ABDOMINAL HYSTERECTOMY     partial  . BLADDER SUSPENSION    . TONSILLECTOMY      Prior to Admission medications   Medication Sig Start Date End Date Taking? Authorizing Provider  cephALEXin (KEFLEX) 500 MG capsule Take 1 capsule (500 mg total) by mouth 3 (three) times daily for 7 days. 03/19/18 03/26/18  Lannie Fields, PA-C  dicyclomine (BENTYL) 20 MG tablet Take 1 tablet (20 mg total) by mouth 3 (three) times daily as needed for spasms. 08/21/16 08/21/17  Orbie Pyo, MD  ibuprofen (ADVIL,MOTRIN) 800 MG tablet Take by mouth. 06/16/14   [provider]  methocarbamol (ROBAXIN) 500 MG tablet Days 1-3 take 1596m tid, Days 4-6 take 10061mtid, Days 7+ take 100070mid PRN. 11/30/16   [provider]  naproxen (NAPROSYN) 500 MG tablet Take by mouth. 08/22/14   [provider]  promethazine (PHENERGAN) 12.5 MG tablet Take 1  tablet (12.5 mg total) by mouth every 6 (six) hours as needed for nausea or vomiting. 06/14/17   RobMerlyn LotD  sulfamethoxazole-trimethoprim (BACTRIM DS,SEPTRA DS) 800-160 MG tablet Take 1 tablet by mouth 2 (two) times daily for 7 days. 03/19/18 03/26/18  WooLannie FieldsA-C  tobramycin (TOBREX) 0.3 % ophthalmic solution Place 2 drops into the left eye every 4 (four) hours. 04/06/17   Fisher, SusLinden DolinA-C  traMADol (ULTRAM) 50 MG tablet Take 1 tablet (50 mg total) by mouth every 6 (six) hours as needed. 06/14/17 06/14/18  RobMerlyn LotD    Allergies Patient has no known allergies.  Family History  Problem Relation Age of Onset  . Breast cancer Paternal Aunt        20's    Social History Social History   Tobacco Use  . Smoking status: Never Smoker  . Smokeless tobacco: Never Used  Substance Use Topics  . Alcohol use: Yes    Comment: occasionally  . Drug use: No     Review of Systems  Constitutional: No fever/chills Eyes: No visual changes. No discharge ENT: No upper respiratory complaints. Cardiovascular: no chest pain. Respiratory: no cough. No SOB. Gastrointestinal: No abdominal pain.  No nausea, no vomiting.  No diarrhea.  No constipation. Genitourinary: Negative for dysuria. No hematuria Musculoskeletal: Negative for musculoskeletal pain. Skin: Patient has erythema of the proximal right forearm. Neurological: Negative for headaches, focal weakness or numbness.   ____________________________________________   PHYSICAL  EXAM:  VITAL SIGNS: ED Triage Vitals  Enc Vitals Group     BP 03/19/18 1912 (!) 139/107     Pulse Rate 03/19/18 1912 (!) 106     Resp 03/19/18 1912 18     Temp 03/19/18 1912 98.5 F (36.9 C)     Temp Source 03/19/18 1912 Oral     SpO2 03/19/18 1912 99 %     Weight 03/19/18 1910 190 lb (86.2 kg)     Height 03/19/18 1910 5' (1.524 m)     Head Circumference --      Peak Flow --      Pain Score 03/19/18 1910 8     Pain Loc --       Pain Edu? --      Excl. in [place]? --      Constitutional: Alert and oriented. Well appearing and in no acute distress. Eyes: Conjunctivae are normal. PERRL. EOMI. Head: Atraumatic. Cardiovascular: Normal rate, regular rhythm. Normal S1 and S2.  Good peripheral circulation. Respiratory: Normal respiratory effort without tachypnea or retractions. Lungs CTAB. Good air entry to the bases with no decreased or absent breath sounds. Musculoskeletal: Full range of motion to all extremities. No gross deformities appreciated. Neurologic:  Normal speech and language. No gross focal neurologic deficits are appreciated.  Skin: Patient has approximately 4 cm of circumferential cellulitis along the proximal right forearm.  No appreciable induration or palpable fluctuance.  Patient has flaccid bulla formation. Psychiatric: Mood and affect are normal. Speech and behavior are normal. Patient exhibits appropriate insight and judgement.   ____________________________________________   LABS (all labs ordered are listed, but only abnormal results are displayed)  Labs Reviewed - No data to display ____________________________________________  EKG   ____________________________________________  RADIOLOGY  No results found.  ____________________________________________    PROCEDURES  Procedure(s) performed:    Procedures    Medications - No data to display   ____________________________________________   INITIAL IMPRESSION / ASSESSMENT AND PLAN / ED COURSE  Pertinent labs & imaging results that were available during my care of the patient were reviewed by me and considered in my medical decision making (see chart for details).  Review of the [place] CSRS was performed in accordance of the [place] prior to dispensing any controlled drugs.      Assessment and plan Cellulitis Patient presents to the emergency department with approximately 4 cm of circumferential cellulitis along the proximal  aspect of the right forearm.  Patient had no swelling of the right elbow and was able to perform full range of motion. No overlying erythema at the right elbow. Low suspicion for septic arthritis.  There is no appreciable induration or palpable fluctuance that would suggest abscess.  Patient was treated empirically with Bactrim and Keflex.  Strict return precautions were given to return to the emergency department with worsening symptoms.  All patient questions were answered.     ____________________________________________  FINAL CLINICAL IMPRESSION(S) / ED DIAGNOSES  Final diagnoses:  Cellulitis of right upper extremity      NEW MEDICATIONS STARTED DURING THIS VISIT:  ED Discharge Orders         Ordered    sulfamethoxazole-trimethoprim (BACTRIM DS,SEPTRA DS) 800-160 MG tablet  2 times daily     03/19/18 1941    cephALEXin (KEFLEX) 500 MG capsule  3 times daily     03/19/18 1941              This chart was dictated using voice recognition software/Dragon. Despite best  efforts to proofread, errors can occur which can change the meaning. Any change was purely unintentional.    Lannie Fields, PA-C 03/19/18 2020    Harvest Dark, MD 03/19/18 2306

## 2018-03-19 NOTE — ED Triage Notes (Signed)
?  insect bite.  Noticed area last night.  Today redder and more painful.

## 2018-03-19 NOTE — ED Notes (Signed)
No peripheral IV placed this visit.    Discharge instructions reviewed with patient. Questions fielded by this RN. Patient verbalizes understanding of instructions. Patient discharged home in stable condition per provider. No acute distress noted at time of discharge.

## 2018-05-29 IMAGING — CT CT ABD-PELV W/ CM
2 of 5 series · 16 of 46 positions shown, 18 images · IV contrast (APPLIED)
Comparison: 08/21/2016

CLINICAL DATA: Abdominal pain acute generalized, watery diarrhea
for 4 days, LEFT side abdominal pain, history atrial fibrillation

EXAM:
CT ABDOMEN AND PELVIS WITH CONTRAST
TECHNIQUE: Multidetector CT imaging of the abdomen and pelvis was performed
using the standard protocol following bolus administration of
intravenous contrast. Sagittal and coronal MPR images reconstructed
from axial data set.
CONTRAST:  100mL SAXX8S-EKK IOPAMIDOL (SAXX8S-EKK) INJECTION 61% IV.
No oral contrast.

[Series 2: axial st · axial · 0.96mm/px · z∈[-922,-527]mm · 13 of 89 slices shown, 15 images]
[im 5/89  soft-tissue]
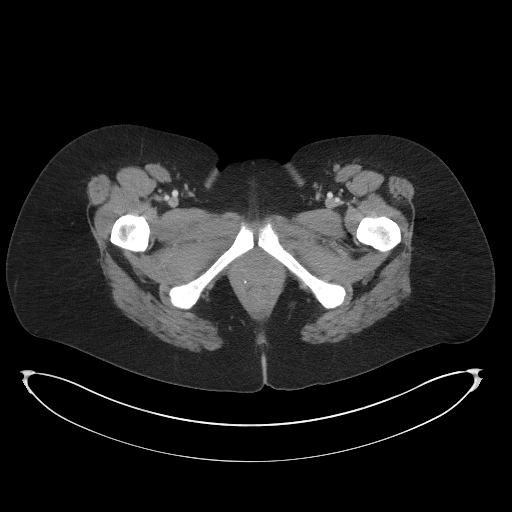
[im 5/89  bone]
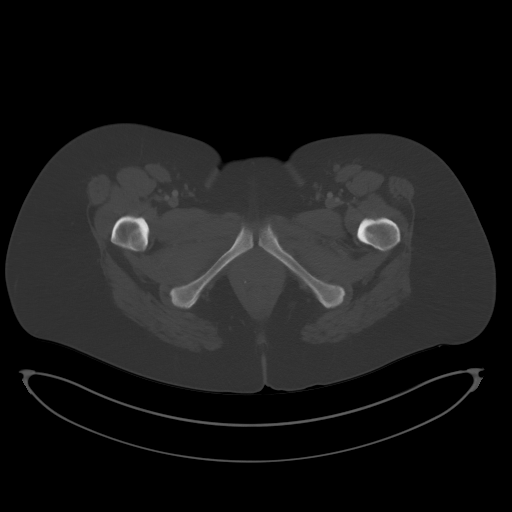
[im 10/89  soft-tissue]
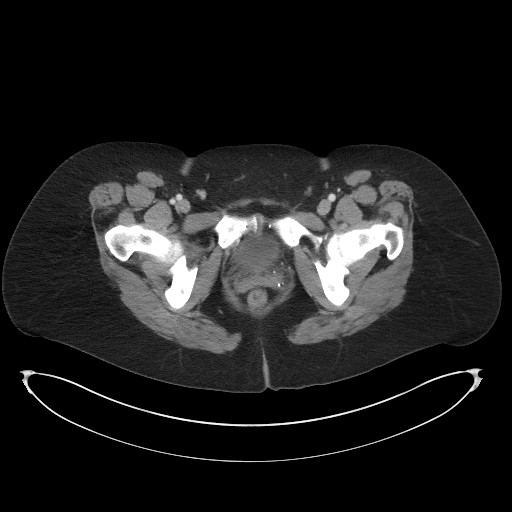
[im 20/89  soft-tissue]
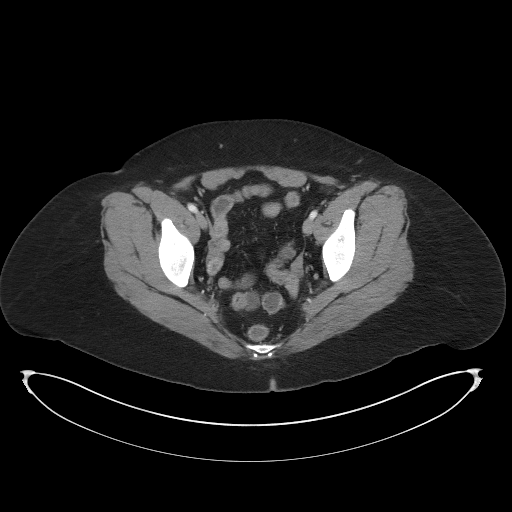
[im 25/89  soft-tissue]
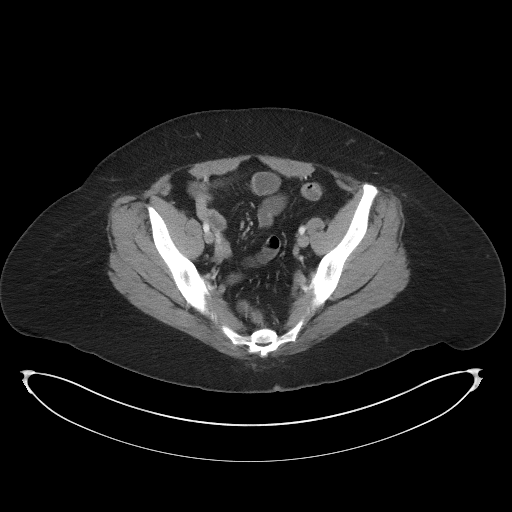
[im 30/89  soft-tissue]
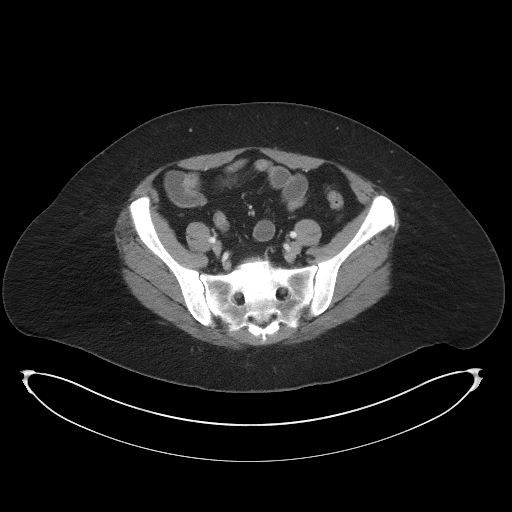
[im 40/89  soft-tissue]
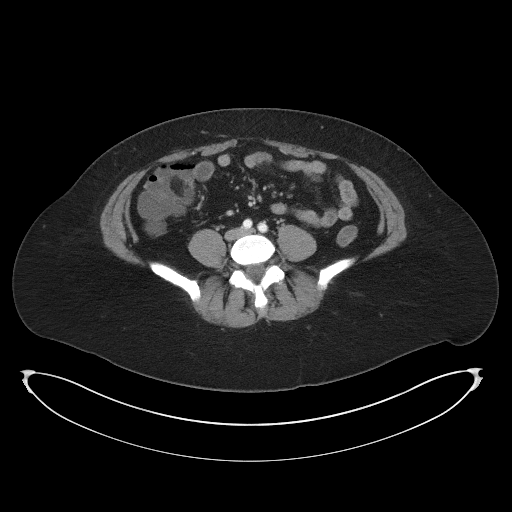
[im 45/89  soft-tissue]
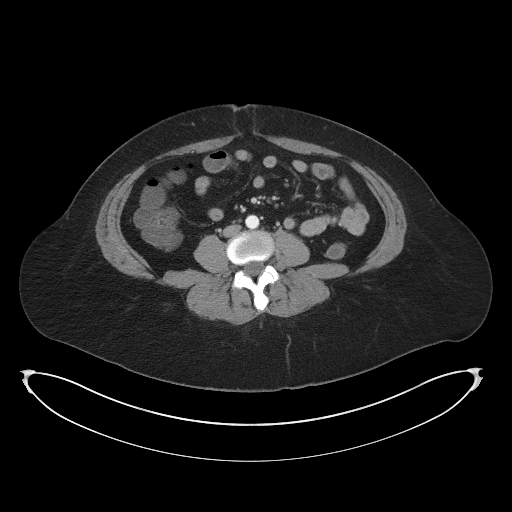
[im 49/89  soft-tissue]
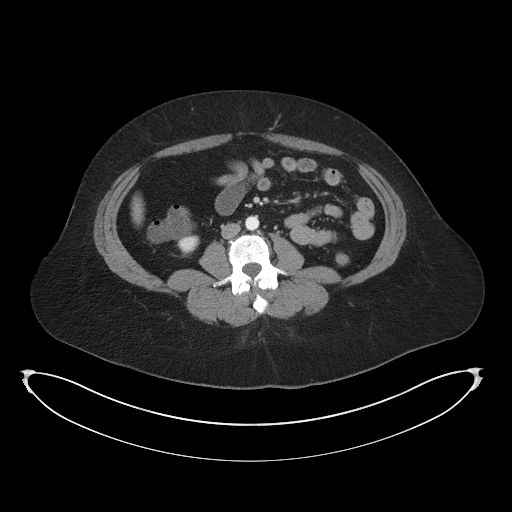
[im 59/89  soft-tissue]
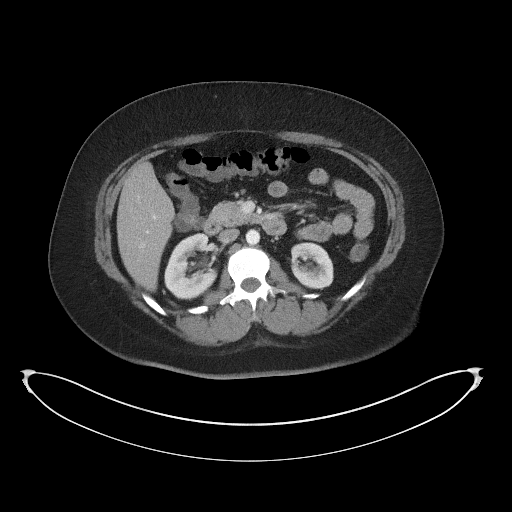
[im 59/89  bone]
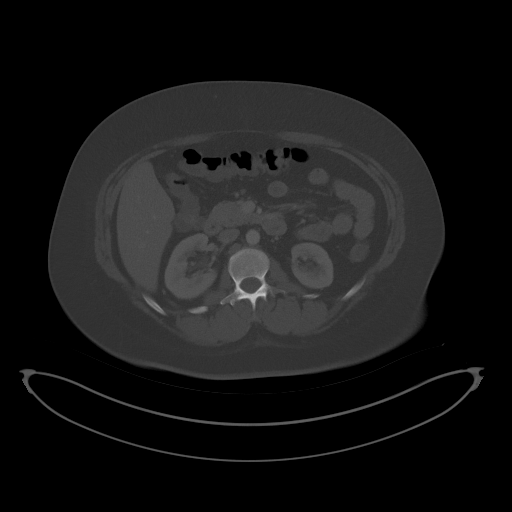
[im 64/89  soft-tissue]
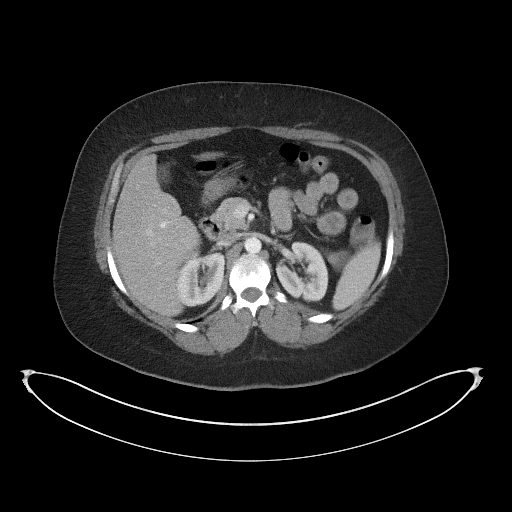
[im 69/89  soft-tissue]
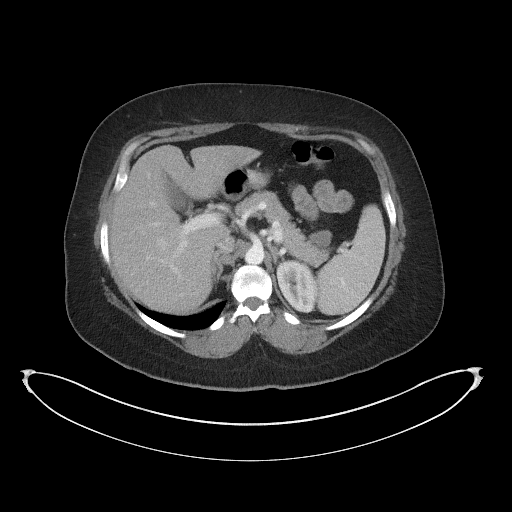
[im 79/89  soft-tissue]
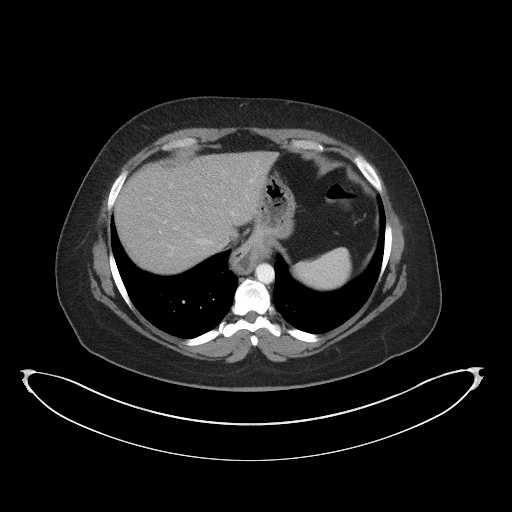
[im 84/89  soft-tissue]
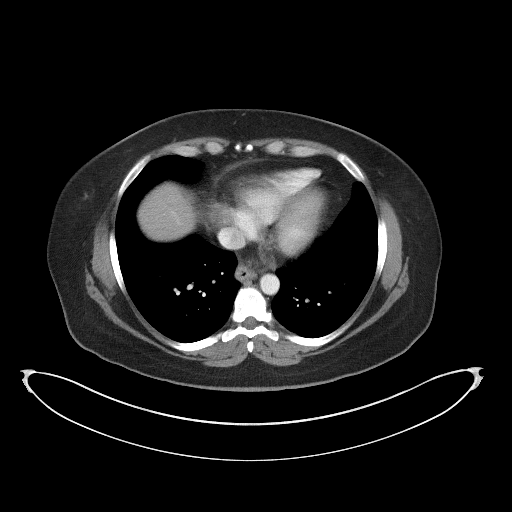

[Series 5: coronal st · coronal · 0.71mm/px · 3 of 92 slices shown]
[im 31/92  soft-tissue]
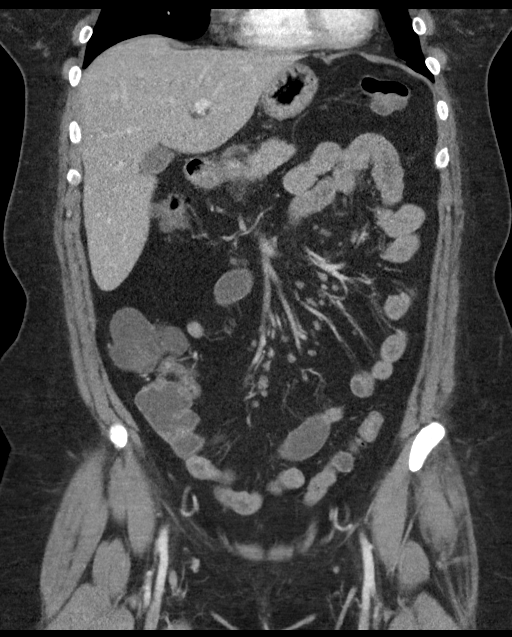
[im 41/92  soft-tissue]
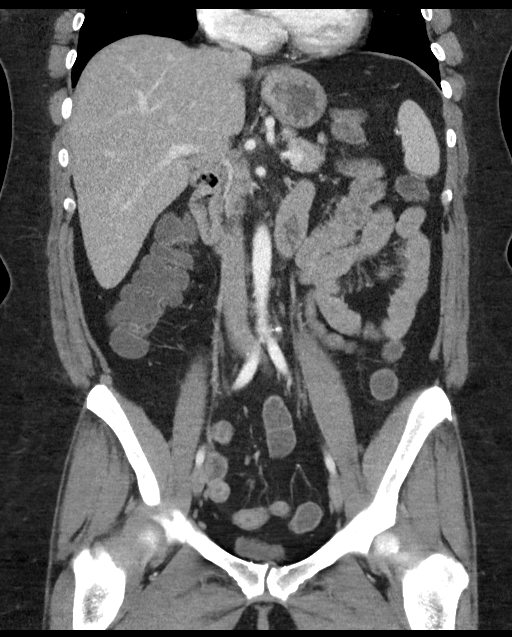
[im 51/92  soft-tissue]
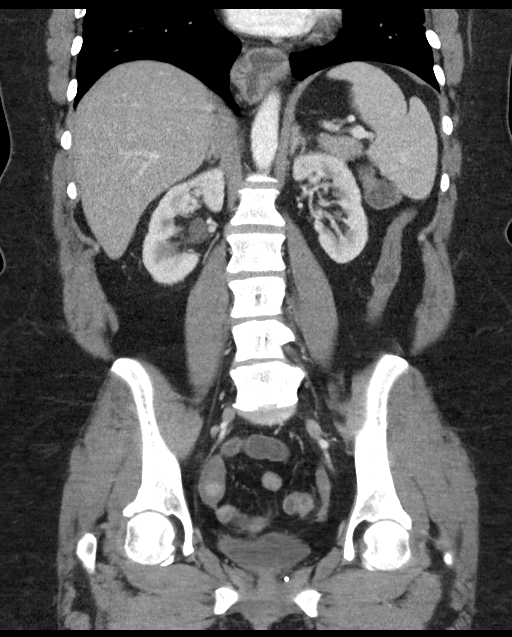

[16 of 46 positions shown; findings below may reference images not displayed]

FINDINGS: Lower chest: Lung bases clear

Hepatobiliary: Gallbladder and liver normal appearance

Pancreas: Normal appearance

Spleen: Normal appearance

Adrenals/Urinary Tract: 5 mm cyst upper pole LEFT kidney. Adrenal
glands, kidneys, ureters, and bladder otherwise normal appearance.

Stomach/Bowel: Small to moderate-sized hiatal hernia. Remainder of
stomach unremarkable. Normal appendix. Mild fluid within proximal:
Which could be related to a history of diarrhea. Remaining bowel
loops unremarkable. No definite bowel wall thickening, obstruction,
or dilatation.

Vascular/Lymphatic: Vascular structures unremarkable. Numerous
though normal sized lymph nodes throughout mesentery. No abdominal
or pelvic adenopathy.

Reproductive: Uterus surgically absent.  Unremarkable ovaries.

Other: Umbilical hernia containing fat.  No free air or free fluid.

Musculoskeletal: BILATERAL spondylolysis L5 without
spondylolisthesis. No acute osseous findings.
IMPRESSION: Small to moderate-sized hiatal hernia.

Umbilical hernia containing fat.

BILATERAL spondylolysis L5.

No acute intra-abdominal or intrapelvic abnormalities.

## 2018-11-02 ENCOUNTER — Other Ambulatory Visit: Payer: Self-pay

## 2018-11-08 ENCOUNTER — Ambulatory Visit
Admission: RE | Admit: 2018-11-08 | Discharge: 2018-11-08 | Disposition: A | Discharge status: Home / Self Care | Payer: Self-pay | Source: Ambulatory Visit | Attending: Oncology | Admitting: Oncology

## 2018-11-08 ENCOUNTER — Encounter: Payer: Self-pay | Admitting: *Deleted

## 2018-11-08 ENCOUNTER — Ambulatory Visit: Payer: Self-pay | Attending: Oncology | Admitting: *Deleted

## 2018-11-08 ENCOUNTER — Other Ambulatory Visit: Payer: Self-pay

## 2018-11-08 VITALS — BP 135/100 | HR 89 | Temp 97.5°F | Ht 64.0 in | Wt 210.0 lb

## 2018-11-08 DIAGNOSIS — N644 Mastodynia: Secondary | ICD-10-CM

## 2018-11-08 NOTE — Progress Notes (Signed)
  Subjective:     Patient ID: Allison Sherman, female   DOB: 1975/03/21, 43 y.o.   MRN: [id number]  HPI   Review of Systems     Objective:   Physical Exam Chest:     Breasts:        Right: No swelling, bleeding, inverted nipple, mass, nipple discharge, skin change or tenderness.        Left: Tenderness present. No swelling, bleeding, inverted nipple, mass, nipple discharge or skin change.    Lymphadenopathy:     Upper Body:     Right upper body: No supraclavicular or axillary adenopathy.     Left upper body: No supraclavicular or axillary adenopathy.        Assessment:     43 year old White female returns to [facility] with complaints of targeted breast pain at 8:00 left breast.  This is the same area of her concern in 2017.  States the tenderness is intermittent, has been present a couple of months.  Denies any aggravating or alleviating factors.  On clinical breast exam there is no dominant mass, skin changes, nipple discharge or lymphadenopathy.  The patient is very tender to palpation at 8:00 left breast.  Taught self breast awareness. Patient has a Tyrer-Cuzcick risk assessment score of 12.1% lifetime risk of breast cancer.   Patient has been screened for eligibility.  She does not have any insurance, Medicare or Medicaid.  She also meets financial eligibility.  Hand-out given on the Affordable Care Act.    Plan:     Will get bilateral diagnostic mammogram and ultrasound.  If no findings on imaging I can have the patient return in 3 months for further evaluation.

## 2018-11-09 ENCOUNTER — Encounter: Payer: Self-pay | Admitting: Emergency Medicine

## 2018-11-09 ENCOUNTER — Ambulatory Visit
Admission: EM | Admit: 2018-11-09 | Discharge: 2018-11-09 | Disposition: A | Discharge status: Home / Self Care | Payer: Self-pay | Attending: Urgent Care | Admitting: Urgent Care

## 2018-11-09 ENCOUNTER — Other Ambulatory Visit: Payer: Self-pay

## 2018-11-09 DIAGNOSIS — A5901 Trichomonal vulvovaginitis: Secondary | ICD-10-CM

## 2018-11-09 DIAGNOSIS — B9689 Other specified bacterial agents as the cause of diseases classified elsewhere: Secondary | ICD-10-CM

## 2018-11-09 DIAGNOSIS — N760 Acute vaginitis: Secondary | ICD-10-CM

## 2018-11-09 DIAGNOSIS — Z113 Encounter for screening for infections with a predominantly sexual mode of transmission: Secondary | ICD-10-CM

## 2018-11-09 LAB — WET PREP, GENITAL
Sperm: NONE SEEN
Yeast Wet Prep HPF POC: NONE SEEN

## 2018-11-09 MED ORDER — METRONIDAZOLE 500 MG PO TABS: 500.0000 mg | ORAL_TABLET | Freq: Two times a day (BID) | ORAL | 0 refills | Status: DC

## 2018-11-09 NOTE — ED Provider Notes (Signed)
[facility] ____________________________________________  Time seen: Approximately 10:09 AM  I have reviewed the triage vital signs and the nursing notes.   HISTORY  Chief Complaint Vaginal Itching and Vaginal Discharge   HPI Allison Sherman is a 43 y.o. female presenting for evaluation of approximately 1 week of vaginal irritation, vaginal itching and intermittent discharge.  Patient reports she initially thought it was consistent with previous vaginal yeast infections in which she used over-the-counter Monistat and Diflucan without resolution.  Patient states coming in for further evaluation as concern for possible BV.  Reports sexually active with her same partner, last intercourse 2 weeks ago.  Partial hysterectomy history.  Request to go ahead and have gonorrhea and Chlamydia testing.  Denies dysuria, abdominal pain, back pain or fevers.  Reports otherwise doing well.  No recent sickness.  Past Medical History:  Diagnosis Date  . A-fib ([place])   . Renal disorder    kidney failure at 12    There are no active problems to display for this patient.   Past Surgical History:  Procedure Laterality Date  . ABDOMINAL HYSTERECTOMY     partial  . BLADDER SUSPENSION    . TONSILLECTOMY       No current facility-administered medications for this encounter.   Current Outpatient Medications:  .  dicyclomine (BENTYL) 20 MG tablet, Take 1 tablet (20 mg total) by mouth 3 (three) times daily as needed for spasms., Disp: 30 tablet, Rfl: 0 .  metroNIDAZOLE (FLAGYL) 500 MG tablet, Take 1 tablet (500 mg total) by mouth 2 (two) times daily., Disp: 14 tablet, Rfl: 0  Allergies Patient has no known allergies.  Family History  Problem Relation Age of Onset  . Breast cancer Paternal Aunt        20's    Social History Social History   Tobacco Use  . Smoking status: Never Smoker  . Smokeless tobacco: Never Used  Substance Use Topics  . Alcohol use: Yes    Comment:  occasionally  . Drug use: No    Review of Systems Constitutional: No fever ENT: No sore throat. Cardiovascular: Denies chest pain. Respiratory: Denies shortness of breath. Gastrointestinal: No abdominal pain.  No nausea, no vomiting.  No diarrhea.   Genitourinary: Negative for dysuria. As above.  Musculoskeletal: Negative for back pain. Skin: Negative for rash.   ____________________________________________   PHYSICAL EXAM:  VITAL SIGNS: ED Triage Vitals  Enc Vitals Group     BP 11/09/18 0927 113/86     Pulse Rate 11/09/18 0927 100     Resp 11/09/18 0927 18     Temp 11/09/18 0927 99.3 F (37.4 C)     Temp Source 11/09/18 0927 Oral     SpO2 11/09/18 0927 99 %     Weight 11/09/18 0925 210 lb (95.3 kg)     Height 11/09/18 0925 5' (1.524 m)     Head Circumference --      Peak Flow --      Pain Score 11/09/18 0924 0     Pain Loc --      Pain Edu? --      Excl. in [place]? --     Constitutional: Alert and oriented. Well appearing and in no acute distress. Eyes: Conjunctivae are normal.  ENT      Head: Normocephalic and atraumatic. Cardiovascular: Normal rate, regular rhythm. Grossly normal heart sounds.  Good peripheral circulation. Respiratory: Normal respiratory effort without tachypnea nor retractions. Breath sounds are clear and equal bilaterally.  No wheezes, rales, rhonchi. Musculoskeletal: Steady gait.  Neurologic:  Normal speech and language. Skin:  Skin is warm, dry and intact. No rash noted. Psychiatric: Mood and affect are normal. Speech and behavior are normal. Patient exhibits appropriate insight and judgment   ___________________________________________   LABS (all labs ordered are listed, but only abnormal results are displayed)  Labs Reviewed  WET PREP, GENITAL - Abnormal; Notable for the following components:      Result Value   Trich, Wet Prep PRESENT (*)    Clue Cells Wet Prep HPF POC PRESENT (*)    WBC, Wet Prep HPF POC MANY (*)    All other  components within normal limits  GC/CHLAMYDIA PROBE AMP  RPR  HIV ANTIBODY (ROUTINE TESTING W REFLEX)    PROCEDURES Procedures   INITIAL IMPRESSION / ASSESSMENT AND PLAN / ED COURSE  Pertinent labs & imaging results that were available during my care of the patient were reviewed by me and considered in my medical decision making (see chart for details).  Well-appearing patient.  Presenting for evaluation of vaginal irritation and itching.  Patient elected for self wet prep as well as requested gonorrhea and Chlamydia testing.  Wet prep positive for clue cells as well as Trichomonas.  Counseled and long discussion had with patient regarding results, will also further test for HIV and syphilis.  Treat with Flagyl.  Good Rx coupon also given for the same.  Supportive care.  Pelvic rest.  Informed partner his partner needs to be treated.Discussed indication, risks and benefits of medications with patient.  Discussed follow up and return parameters including no resolution or any worsening concerns. Patient verbalized understanding and agreed to plan.   ____________________________________________   FINAL CLINICAL IMPRESSION(S) / ED DIAGNOSES  Final diagnoses:  BV (bacterial vaginosis)  Trichomonal vaginitis  Screen for STD (sexually transmitted disease)     ED Discharge Orders         Ordered    metroNIDAZOLE (FLAGYL) 500 MG tablet  2 times daily     11/09/18 1006           Note: This dictation was prepared with Dragon dictation along with smaller phrase technology. Any transcriptional errors that result from this process are unintentional.         Marylene Land, NP 11/09/18 1026

## 2018-11-09 NOTE — Discharge Instructions (Addendum)
Take medication as prescribed.  Pelvic rest.  Partner needs to be treated.  Follow up with your primary care physician this week as needed. Return to Urgent care for new or worsening concerns.

## 2018-11-09 NOTE — ED Triage Notes (Signed)
Patient c/o vaginal itching and mild vaginal discharge that started 1 week ago.  She has tried OTC Monistat with no relief. She also states she had prescription for Diflucan that she took on Monday and this hasn't helped her symptoms.  She states she has had a history of BV before.

## 2018-11-10 LAB — RPR: RPR Ser Ql: NONREACTIVE

## 2018-11-10 LAB — HIV ANTIBODY (ROUTINE TESTING W REFLEX): HIV Screen 4th Generation wRfx: NONREACTIVE

## 2018-11-16 ENCOUNTER — Encounter: Payer: Self-pay | Admitting: *Deleted

## 2018-11-16 NOTE — Progress Notes (Signed)
Letter mailed to inform patient of her appointment to return on 02/07/19 @ 11:00 for a repeat clinical breast exam to reassess her pain.

## 2019-02-06 ENCOUNTER — Encounter: Payer: Self-pay | Admitting: *Deleted

## 2019-02-06 NOTE — Progress Notes (Signed)
Tried to call patient to prescreen for [place] appointment.  No answer.  Left message for patient to return my call.

## 2019-02-07 ENCOUNTER — Ambulatory Visit: Payer: Medicaid Other

## 2019-04-11 LAB — GC/CHLAMYDIA PROBE AMP
Chlamydia trachomatis, NAA: NEGATIVE
Neisseria Gonorrhoeae by PCR: NEGATIVE

## 2020-02-25 ENCOUNTER — Ambulatory Visit (LOCAL_COMMUNITY_HEALTH_CENTER): Payer: Medicaid Other

## 2020-02-25 ENCOUNTER — Other Ambulatory Visit: Payer: Self-pay

## 2020-02-25 DIAGNOSIS — Z111 Encounter for screening for respiratory tuberculosis: Secondary | ICD-10-CM

## 2020-02-28 ENCOUNTER — Other Ambulatory Visit: Payer: Self-pay

## 2020-02-28 ENCOUNTER — Ambulatory Visit (LOCAL_COMMUNITY_HEALTH_CENTER): Payer: Medicaid Other

## 2020-02-28 DIAGNOSIS — Z111 Encounter for screening for respiratory tuberculosis: Secondary | ICD-10-CM

## 2020-02-28 LAB — TB SKIN TEST
Induration: 0 mm
TB Skin Test: NEGATIVE

## 2020-02-28 NOTE — Addendum Note (Signed)
Addended byDahlia Bailiff on: 02/28/2020 08:59 AM   Modules accepted: Level of Service

## 2020-09-30 ENCOUNTER — Encounter: Payer: Self-pay | Admitting: Emergency Medicine

## 2020-09-30 ENCOUNTER — Other Ambulatory Visit: Payer: Self-pay

## 2020-09-30 ENCOUNTER — Emergency Department
Admission: EM | Admit: 2020-09-30 | Discharge: 2020-09-30 | Disposition: A | Discharge status: Home / Self Care | Payer: Medicaid Other | Attending: Emergency Medicine | Admitting: Emergency Medicine

## 2020-09-30 DIAGNOSIS — W57XXXA Bitten or stung by nonvenomous insect and other nonvenomous arthropods, initial encounter: Secondary | ICD-10-CM | POA: Insufficient documentation

## 2020-09-30 DIAGNOSIS — S50862A Insect bite (nonvenomous) of left forearm, initial encounter: Secondary | ICD-10-CM | POA: Insufficient documentation

## 2020-09-30 MED ORDER — FAMOTIDINE 20 MG PO TABS: 20.0000 mg | ORAL_TABLET | Freq: Two times a day (BID) | ORAL | 0 refills | Status: DC

## 2020-09-30 MED ORDER — SULFAMETHOXAZOLE-TRIMETHOPRIM 800-160 MG PO TABS: 1.0000 | ORAL_TABLET | Freq: Two times a day (BID) | ORAL | 0 refills | Status: DC

## 2020-09-30 MED ORDER — SULFAMETHOXAZOLE-TRIMETHOPRIM 800-160 MG PO TABS
1.0000 | ORAL_TABLET | Freq: Once | ORAL | Status: AC
  Administered 2020-09-30: 1 via ORAL
  Filled 2020-09-30: qty 1

## 2020-09-30 NOTE — Discharge Instructions (Addendum)
Take the prescription antibiotic as directed. Apply hydrocortisone cream to reduce swelling. Take OTC Benadryl and/or a non-drowsy antihistamine (Allegra/Claritin//Zyrtec)

## 2020-09-30 NOTE — ED Notes (Signed)
See triage note  Presents with redness and swelling to left arm  Possible insect bite  Afebrile on arrival

## 2020-09-30 NOTE — ED Triage Notes (Signed)
C/O insect/ bug bite to left upper arm.  Dark center and redness around area.  States bite occurred last night.

## 2020-09-30 NOTE — ED Provider Notes (Signed)
[facility] Emergency Department Provider Note ____________________________________________  Time seen: 1014  I have reviewed the triage vital signs and the nursing notes.  HISTORY  Chief Complaint  Insect Bite   HPI Allison Sherman is a 45 y.o. female presents to the ED for evaluation of a insect bite to left upper arm.  Patient reports an area of redness irritation in the area of the central papule.  She denies any fevers, chills, sweats but patient has other insect bites and multiple stages of healing, but denies any anaphylaxis.    Past Medical History:  Diagnosis Date   A-fib [facility])    Renal disorder    kidney failure at 12    There are no problems to display for this patient.   Past Surgical History:  Procedure Laterality Date   ABDOMINAL HYSTERECTOMY     partial   BLADDER SUSPENSION     TONSILLECTOMY      Prior to Admission medications   Medication Sig Start Date End Date Taking? Authorizing Provider  famotidine (PEPCID) 20 MG tablet Take 1 tablet (20 mg total) by mouth 2 (two) times daily for 10 days. 09/30/20 10/10/20 Yes Unnamed Hino, Dannielle Karvonen, PA-C  sulfamethoxazole-trimethoprim (BACTRIM DS) 800-160 MG tablet Take 1 tablet by mouth 2 (two) times daily. 09/30/20  Yes Yichen Gilardi, Dannielle Karvonen, PA-C  dicyclomine (BENTYL) 20 MG tablet Take 1 tablet (20 mg total) by mouth 3 (three) times daily as needed for spasms. 08/21/16 08/21/17  Orbie Pyo, MD  promethazine (PHENERGAN) 12.5 MG tablet Take 1 tablet (12.5 mg total) by mouth every 6 (six) hours as needed for nausea or vomiting. 06/14/17 11/09/18  Merlyn Lot, MD    Allergies Patient has no known allergies.  Family History  Problem Relation Age of Onset   Breast cancer Paternal Aunt        20's    Social History Social History   Tobacco Use   Smoking status: Never   Smokeless tobacco: Never  Substance Use Topics   Alcohol use: Yes    Comment: occasionally   Drug use: No     Review of Systems  Constitutional: Negative for fever. Eyes: Negative for visual changes. ENT: Negative for sore throat. Cardiovascular: Negative for chest pain. Respiratory: Negative for shortness of breath. Gastrointestinal: Negative for abdominal pain, vomiting and diarrhea. Genitourinary: Negative for dysuria. Musculoskeletal: Negative for back pain. Skin: Negative for rash.  Insect bite as above. Neurological: Negative for headaches, focal weakness or numbness. ____________________________________________  PHYSICAL EXAM:  VITAL SIGNS: ED Triage Vitals  Enc Vitals Group     BP 09/30/20 0907 132/90     Pulse Rate 09/30/20 0907 97     Resp 09/30/20 0907 18     Temp 09/30/20 0907 98.7 F (37.1 C)     Temp Source 09/30/20 0907 Oral     SpO2 09/30/20 0907 93 %     Weight 09/30/20 0906 210 lb 1.6 oz (95.3 kg)     Height 09/30/20 0906 5' (1.524 m)     Head Circumference --      Peak Flow --      Pain Score 09/30/20 0906 6     Pain Loc --      Pain Edu? --      Excl. in [place]? --     Constitutional: Alert and oriented. Well appearing and in no distress. Head: Normocephalic and atraumatic. Eyes: Conjunctivae are normal. Normal extraocular movements Cardiovascular: Normal rate, regular rhythm.  Normal distal pulses. Respiratory: Normal respiratory effort.  Musculoskeletal: Nontender with normal range of motion in all extremities.  Neurologic:  Normal gait without ataxia. Normal speech and language. No gross focal neurologic deficits are appreciated. Skin:  Skin is warm, dry and intact. No rash noted.  With the area today external right elbow consistent with local insect bite and reaction.  There is an area of warmth and erythematous skin with a central papule.  No excoriations, indurations, purulence appreciated. Psychiatric: Mood and affect are normal. Patient exhibits appropriate insight and judgment. ____________________________________________    {LABS (pertinent  positives/negatives)  ____________________________________________  {EKG  ____________________________________________   RADIOLOGY Official radiology report(s): No results found. ____________________________________________  PROCEDURES  Bactrim DS 1 PO  Procedures ____________________________________________   INITIAL IMPRESSION / ASSESSMENT AND PLAN / ED COURSE  As part of my medical decision making, I reviewed the following data within the electronic MEDICAL RECORD NUMBER Notes from prior ED visits     DDX: local reaction, cellulitis, abscess  Patient ED evaluation of a local insect bite to the left elbow.  Exam is overall benign reassuring as it shows no signs of any acute anaphylaxis or cellulitic reaction.  Symptoms are likely representing a local reaction with several other recently healed insect bites.  Patient be treated empirically with Bactrim at this time patient is also encouraged to take over-the-counter allergy medicine for symptom relief.  Prescription for famotidine is provided for benefit.  Return precautions have been reviewed.    Allison Sherman was evaluated in Emergency Department on 09/30/2020 for the symptoms described in the history of present illness. She was evaluated in the context of the global COVID-19 pandemic, which necessitated consideration that the patient might be at risk for infection with the SARS-CoV-2 virus that causes COVID-19. Institutional protocols and algorithms that pertain to the evaluation of patients at risk for COVID-19 are in a state of rapid change based on information released by regulatory bodies including the CDC and federal and state organizations. These policies and algorithms were followed during the patient's care in the ED. ____________________________________________  FINAL CLINICAL IMPRESSION(S) / ED DIAGNOSES  Final diagnoses:  Insect bite of left forearm, initial encounter      Melvenia Needles, PA-C 09/30/20  1717    Duffy Bruce, MD 10/01/20 1914

## 2021-02-26 ENCOUNTER — Ambulatory Visit (INDEPENDENT_AMBULATORY_CARE_PROVIDER_SITE_OTHER): Payer: 59

## 2021-02-26 ENCOUNTER — Ambulatory Visit: Payer: 59 | Admitting: Podiatry

## 2021-02-26 ENCOUNTER — Encounter: Payer: Self-pay | Admitting: Podiatry

## 2021-02-26 ENCOUNTER — Other Ambulatory Visit: Payer: Self-pay

## 2021-02-26 VITALS — BP 114/69 | HR 86 | Temp 98.1°F | Resp 20

## 2021-02-26 DIAGNOSIS — Z01818 Encounter for other preprocedural examination: Secondary | ICD-10-CM | POA: Diagnosis not present

## 2021-02-26 DIAGNOSIS — M722 Plantar fascial fibromatosis: Secondary | ICD-10-CM | POA: Diagnosis not present

## 2021-02-26 NOTE — Progress Notes (Signed)
Subjective:  Patient ID: Allison Sherman, female    DOB: 1975/04/17,  MRN: [id number]  Chief Complaint  Patient presents with   Foot Pain    "I have these knots on the bottom of my feet.  They're starting to come up on the sides."    46 y.o. female presents with the above complaint.  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  She states the right side is greater than left it is generally very painful.  Is been going for 10 years has progressed to gotten worse.  Is walking and standing long period of time on it.  She has tried all conservative treatment options she would like to discuss surgical excision at this time.  She is tried Environmental consultant modification orthotics and so none of which has helped.  She denies seeing anyone else prior to seeing me.  She denies any other acute complaints.   Review of Systems: Negative except as noted in the HPI. Denies N/V/F/Ch.  Past Medical History:  Diagnosis Date   A-fib [facility])    Renal disorder    kidney failure at 19    Current Outpatient Medications:    dicyclomine (BENTYL) 20 MG tablet, Take 1 tablet (20 mg total) by mouth 3 (three) times daily as needed for spasms., Disp: 30 tablet, Rfl: 0   famotidine (PEPCID) 20 MG tablet, Take 1 tablet (20 mg total) by mouth 2 (two) times daily for 10 days., Disp: 20 tablet, Rfl: 0   sulfamethoxazole-trimethoprim (BACTRIM DS) 800-160 MG tablet, Take 1 tablet by mouth 2 (two) times daily., Disp: 20 tablet, Rfl: 0  Social History   Tobacco Use  Smoking Status Never  Smokeless Tobacco Never    No Known Allergies Objective:   Vitals:   02/26/21 0847  BP: 114/69  Pulse: 86  Resp: 20  Temp: 98.1 F (36.7 C)   There is no height or weight on file to calculate BMI. Constitutional Well developed. Well nourished.  Vascular Dorsalis pedis pulses palpable bilaterally. Posterior tibial pulses palpable bilaterally. Capillary refill normal to all digits.   No cyanosis or clubbing noted. Pedal hair growth normal.  Neurologic Normal speech. Oriented to person, place, and time. Epicritic sensation to light touch grossly present bilaterally.  Dermatologic Bilateral right greater than left non mobile single lobulated indurated soft tissue mass to the plantar fascia consistent with plantar fibroma.  Negative transilluminates  Orthopedic: Normal joint ROM without pain or crepitus bilaterally. No visible deformities. No bony tenderness.   Radiographs: None Assessment:   1. Plantar fascial fibromatosis   2. Preoperative examination    Plan:  Patient was evaluated and treated and all questions answered.  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  I will start with the right side as this is a plantar incision and patient needs to be limited weightbearing to the right lower extremity.  I discussed with her she would need to wear a boot and be nonweightbearing to the right lower extremity she states understanding.  I discussed the recurrence rate of plantar fibroma which tends to be greater than 50% she states understanding and would like  to proceed despite the recurrence -Informed surgical risk consent was reviewed and read aloud to the patient.  I reviewed the films.  I have discussed my findings with the patient in great detail.  I have discussed all risks including but not limited to infection, stiffness, scarring, limp, disability, deformity, damage to blood vessels and nerves, numbness, poor healing, need for braces, arthritis, chronic pain, amputation, death.  All benefits and realistic  expectations discussed in great detail.  I have made no promises as to the outcome.  I have provided realistic expectations.  I have offered the patient a 2nd opinion, which they have declined and assured me they preferred to proceed despite the risks -This will be a staged procedure will return to the operating room to the left side in few weeks on the right side has healed   No follow-ups on file.

## 2021-04-01 ENCOUNTER — Telehealth: Payer: Self-pay | Admitting: Urology

## 2021-04-01 NOTE — Telephone Encounter (Signed)
DOS - 04/13/21  PLANTAR FIBROMA RIGHT --- [id number]  [employer] EFFECTIVE DATE - 02/22/21  SPOKE WITH [facility] WITH [employer] AND SHE STATED THAT CPT CODE 80165 HAS BEEN APPROVED, AUTH # [id number], GOOD FROM 03/16/21 - 06/14/21.   REF # [facility] 04/01/21 7:47 MST

## 2021-04-02 ENCOUNTER — Ambulatory Visit (INDEPENDENT_AMBULATORY_CARE_PROVIDER_SITE_OTHER): Payer: 59 | Admitting: Nurse Practitioner

## 2021-04-02 ENCOUNTER — Other Ambulatory Visit: Payer: Self-pay

## 2021-04-02 ENCOUNTER — Encounter: Payer: Self-pay | Admitting: Nurse Practitioner

## 2021-04-02 VITALS — BP 140/90 | HR 90 | Temp 98.4°F | Resp 16 | Ht 60.0 in | Wt 214.2 lb

## 2021-04-02 DIAGNOSIS — Z7689 Persons encountering health services in other specified circumstances: Secondary | ICD-10-CM

## 2021-04-02 DIAGNOSIS — Z1231 Encounter for screening mammogram for malignant neoplasm of breast: Secondary | ICD-10-CM

## 2021-04-02 DIAGNOSIS — E782 Mixed hyperlipidemia: Secondary | ICD-10-CM

## 2021-04-02 DIAGNOSIS — Z1212 Encounter for screening for malignant neoplasm of rectum: Secondary | ICD-10-CM

## 2021-04-02 DIAGNOSIS — G43009 Migraine without aura, not intractable, without status migrainosus: Secondary | ICD-10-CM | POA: Diagnosis not present

## 2021-04-02 DIAGNOSIS — R0602 Shortness of breath: Secondary | ICD-10-CM

## 2021-04-02 DIAGNOSIS — E559 Vitamin D deficiency, unspecified: Secondary | ICD-10-CM

## 2021-04-02 DIAGNOSIS — R5383 Other fatigue: Secondary | ICD-10-CM | POA: Diagnosis not present

## 2021-04-02 DIAGNOSIS — I480 Paroxysmal atrial fibrillation: Secondary | ICD-10-CM

## 2021-04-02 DIAGNOSIS — E538 Deficiency of other specified B group vitamins: Secondary | ICD-10-CM

## 2021-04-02 DIAGNOSIS — Z1211 Encounter for screening for malignant neoplasm of colon: Secondary | ICD-10-CM

## 2021-04-02 DIAGNOSIS — I10 Essential (primary) hypertension: Secondary | ICD-10-CM

## 2021-04-02 MED ORDER — TOPIRAMATE 25 MG PO TABS: 25.0000 mg | ORAL_TABLET | Freq: Every day | ORAL | 2 refills | Status: DC

## 2021-04-02 NOTE — Progress Notes (Signed)
[facility] [place], [place] [address]  Internal MEDICINE  Office Visit Note  Patient Name: Allison Sherman  [id number]  [id number]  Date of Service: 04/02/2021   Complaints/HPI Pt is here for establishment of PCP. Chief Complaint  Patient presents with   New Patient (Initial Visit)    Left ankle swells everyday, joint pain, muscles seem tender    Weight Loss   Migraine   Anxiety   Depression   HPI Allison Sherman presents for a new patient visit to establish care. Has not been to a provider in many years. She had acute renal failure at age 46, resolved now. Has a history of paroxysmal afib, anxiety, depression, migraines, GERD. She reports fatigue and excessive tiredness. She has a family history of heart disease and diabetes and her father had prostate cancer and lung cancer. She had a total hysterectomy.  She works full time as a Public librarian at [employer]. She lives at home with her family. She has been making positive changes to her diet. She does not exercise much due to having problems with her left foot. She is a nonsmoker but is around people who smoke. She rarely ever drinks and denies any recreational drug use. --She reports having migraines for the past 15 years. She states that she has at least 15 migraine days per month. She reports having nausea, light sensitivity and increased pain with movement are symptoms that she experiences with her migraines. She has tried excedrine migraine OTC medication but has not tried any prescription medication.     Current Medication: Outpatient Encounter Medications as of 04/02/2021  Medication Sig   topiramate (TOPAMAX) 25 MG tablet Take 1 tablet (25 mg total) by mouth daily.   [DISCONTINUED] dicyclomine (BENTYL) 20 MG tablet Take 1 tablet (20 mg total) by mouth 3 (three) times daily as needed for spasms.   [DISCONTINUED] famotidine (PEPCID) 20 MG tablet Take 1 tablet (20 mg total) by mouth 2 (two) times daily for 10  days.   [DISCONTINUED] promethazine (PHENERGAN) 12.5 MG tablet Take 1 tablet (12.5 mg total) by mouth every 6 (six) hours as needed for nausea or vomiting.   [DISCONTINUED] sulfamethoxazole-trimethoprim (BACTRIM DS) 800-160 MG tablet Take 1 tablet by mouth 2 (two) times daily.   No facility-administered encounter medications on file as of 04/02/2021.    Surgical History: Past Surgical History:  Procedure Laterality Date   ABDOMINAL HYSTERECTOMY     partial   BLADDER SUSPENSION     TONSILLECTOMY      Medical History: Past Medical History:  Diagnosis Date   A-fib ([place])    Anxiety    Depression    Renal disorder    kidney failure at 46    Family History: Family History  Problem Relation Age of Onset   Heart disease Mother    Arthritis Mother    Heart disease Father    Cancer Father    Arthritis Father    Breast cancer Paternal Aunt        20's   Arthritis Maternal Grandmother    Diabetes Maternal Grandfather    Arthritis Maternal Grandfather     Social History   Socioeconomic History   Marital status: Married    Spouse name: Not on file   Number of children: Not on file   Years of education: Not on file   Highest education level: Not on file  Occupational History   Not on file  Tobacco Use   Smoking  status: Never   Smokeless tobacco: Never  Substance and Sexual Activity   Alcohol use: Yes    Comment: occasionally   Drug use: No   Sexual activity: Not on file  Other Topics Concern   Not on file  Social History Narrative   Not on file   Social Determinants of Health   Financial Resource Strain: Not on file  Food Insecurity: Not on file  Transportation Needs: Not on file  Physical Activity: Not on file  Stress: Not on file  Social Connections: Not on file  Intimate Partner Violence: Not on file     Review of Systems  Constitutional:  Negative for chills, fatigue and unexpected weight change.  HENT:  Negative for congestion, rhinorrhea, sneezing  and sore throat.   Eyes:  Negative for redness.  Respiratory:  Negative for cough, chest tightness and shortness of breath.   Cardiovascular:  Negative for chest pain and palpitations.  Gastrointestinal:  Negative for abdominal pain, constipation, diarrhea, nausea and vomiting.  Genitourinary:  Negative for dysuria and frequency.  Musculoskeletal:  Positive for arthralgias. Negative for back pain, joint swelling and neck pain.  Skin:  Negative for rash.  Neurological: Negative.  Negative for tremors and numbness.  Hematological:  Negative for adenopathy. Does not bruise/bleed easily.  Psychiatric/Behavioral:  Negative for behavioral problems (Depression), sleep disturbance and suicidal ideas. The patient is not nervous/anxious.    Vital Signs: BP 140/90    Pulse 90    Temp 98.4 F (36.9 C)    Resp 16    Ht 5' (1.524 m)    Wt 214 lb 3.2 oz (97.2 kg)    SpO2 98%    BMI 41.83 kg/m    Physical Exam Vitals reviewed.  Constitutional:      General: She is not in acute distress.    Appearance: Normal appearance. She is not ill-appearing.  HENT:     Head: Normocephalic and atraumatic.  Eyes:     Pupils: Pupils are equal, round, and reactive to light.  Cardiovascular:     Rate and Rhythm: Normal rate and regular rhythm.  Pulmonary:     Effort: Pulmonary effort is normal. No respiratory distress.  Neurological:     Mental Status: She is alert and oriented to person, place, and time.  Psychiatric:        Mood and Affect: Mood normal.        Behavior: Behavior normal.      Assessment/Plan: 1. Migraine without aura and without status migrainosus, not intractable Will try topiramate as a preventative for migraines. May continue to use excedrine migraine OTC for acute migraines. Will prescribed an abortive medication for acute migraines if OTC medication is not effective anymore.  - topiramate (TOPAMAX) 25 MG tablet; Take 1 tablet (25 mg total) by mouth daily.  Dispense: 30 tablet; Refill:  2  2. Paroxysmal atrial fibrillation ([place]) Labs ordered. Echo ordered.  - CBC with Differential/Platelet - CMP14+EGFR - TSH + free T4 - C-reactive protein - Pro b natriuretic peptide (BNP)[facility]) - ECHOCARDIOGRAM COMPLETE; Future - Sed Rate (ESR)  3. SOB (shortness of breath) on exertion SOB with minimal exertion, echo ordered, BNP ordered, additional labs ordered.  - CBC with Differential/Platelet - CMP14+EGFR - Pro b natriuretic peptide (BNP)[facility])  4. Other fatigue Persistent fatigue, labs ordered.  - CBC with Differential/Platelet - CMP14+EGFR - TSH + free T4  5. Essential hypertension Routine labs ordered. - CBC with Differential/Platelet - TSH +  free T4  6. Mixed hyperlipidemia Routine labs ordered.  - CBC with Differential/Platelet - Lipid Profile - TSH + free T4  7. Vitamin D deficiency Routine lab ordered.  - Vitamin D (25 hydroxy)  8. B12 deficiency Routine labs ordered.  - CBC with Differential/Platelet - B12 and Folate Panel  9. Screening for colorectal cancer Cologuard ordered.  - Cologuard  10. Encounter for screening mammogram for malignant neoplasm of breast Routine mammogram ordered.  - MM 3D SCREEN BREAST BILATERAL; Future  11. Encounter to establish care with new doctor Needs PCP, has not had one in several years. Due for preventive screenings and routine labs.     General Counseling: Khira verbalizes understanding of the findings of todays visit and agrees with plan of treatment. I have discussed any further diagnostic evaluation that may be needed or ordered today. We also reviewed her medications today. she has been encouraged to call the office with any questions or concerns that should arise related to todays visit.    Counseling:  [place] Controlled Substance Database was reviewed by me.  Orders Placed This Encounter  Procedures   MM 3D SCREEN BREAST BILATERAL   Cologuard    CBC with Differential/Platelet   CMP14+EGFR   Lipid Profile   Vitamin D (25 hydroxy)   TSH + free T4   C-reactive protein   B12 and Folate Panel   Pro b natriuretic peptide (BNP)[facility])   Sed Rate (ESR)   ECHOCARDIOGRAM COMPLETE    Meds ordered this encounter  Medications   topiramate (TOPAMAX) 25 MG tablet    Sig: Take 1 tablet (25 mg total) by mouth daily.    Dispense:  30 tablet    Refill:  2    Return in about 1 month (around 04/30/2021) for F/U, eval new med and weight loss, Adebayo Ensminger PCP, do met test next visit and need CPE early avail. .  Time spent:30 Minutes Time spent with patient included reviewing progress notes, labs, imaging studies, and discussing plan for follow up.   Controlled Substance Database was reviewed by me for overdose risk score (ORS)   This patient was seen by Jonetta Osgood, FNP-C in collaboration with Dr. Clayborn Bigness as a part of collaborative care agreement.    Gennette Shadix R. Valetta Fuller, MSN, FNP-C Internal Medicine

## 2021-04-03 ENCOUNTER — Encounter: Payer: Self-pay | Admitting: Nurse Practitioner

## 2021-04-03 ENCOUNTER — Telehealth: Payer: Self-pay

## 2021-04-03 NOTE — Progress Notes (Signed)
I have reviewed the lab results. There are some abnormals that will be discussed at the next office visit. A couple of things I would like to address now instead of waiting for the next visit are her B12 level which is low and her vitamin D level.  Please send prescription for vitamin D 50,000 unit capsule, take 1 capsule by mouth weekly. Will repeat lab in 3-6 months.  For her B12, please set her up with a nurse visit for a B12 injection monthly for at least 3 months.  The rest of the abnormal values on her labs are nothing significant and I will explain what each of them mean at her next office visit.

## 2021-04-06 ENCOUNTER — Telehealth: Payer: Self-pay

## 2021-04-06 ENCOUNTER — Other Ambulatory Visit: Payer: Self-pay

## 2021-04-06 MED ORDER — ERGOCALCIFEROL 1.25 MG (50000 UT) PO CAPS: 50000.0000 [IU] | ORAL_CAPSULE | ORAL | 5 refills | Status: AC

## 2021-04-06 NOTE — Telephone Encounter (Signed)
-----   Message from Jonetta Osgood, NP sent at 04/03/2021  1:51 PM EST ----- I have reviewed the lab results. There are some abnormals that will be discussed at the next office visit. A couple of things I would like to address now instead of waiting for the next visit are her B12 level which is low and her vitamin D level.  Please send prescription for vitamin D 50,000 unit capsule, take 1 capsule by mouth weekly. Will repeat lab in 3-6 months.  For her B12, please set her up with a nurse visit for a B12 injection monthly for at least 3 months.  The rest of the abnormal values on her labs are nothing significant and I will explain what each of them mean at her next office visit.

## 2021-04-07 LAB — LIPID PANEL
Chol/HDL Ratio: 2.9 ratio (ref 0.0–4.4)
Cholesterol, Total: 167 mg/dL (ref 100–199)
HDL: 57 mg/dL (ref >39)
LDL Chol Calc (NIH): 97 mg/dL (ref 0–99)
Triglycerides: 68 mg/dL (ref 0–149)
VLDL Cholesterol Cal: 13 mg/dL (ref 5–40)

## 2021-04-07 LAB — PRO B NATRIURETIC PEPTIDE: NT-Pro BNP: 58 pg/mL (ref 0–249)

## 2021-04-07 LAB — CMP14+EGFR
ALT: 31 IU/L (ref 0–32)
AST: 43 IU/L — ABNORMAL HIGH (ref 0–40)
Albumin/Globulin Ratio: 1.3 (ref 1.2–2.2)
Albumin: 4 g/dL (ref 3.8–4.8)
Alkaline Phosphatase: 101 IU/L (ref 44–121)
BUN/Creatinine Ratio: 9 (ref 9–23)
BUN: 7 mg/dL (ref 6–24)
Bilirubin Total: 0.4 mg/dL (ref 0.0–1.2)
CO2: 17 mmol/L — ABNORMAL LOW (ref 20–29)
Calcium: 9.2 mg/dL (ref 8.7–10.2)
Chloride: 103 mmol/L (ref 96–106)
Creatinine, Ser: 0.81 mg/dL (ref 0.57–1.00)
Globulin, Total: 3 g/dL (ref 1.5–4.5)
Glucose: 103 mg/dL — ABNORMAL HIGH (ref 70–99)
Sodium: 140 mmol/L (ref 134–144)
Total Protein: 7 g/dL (ref 6.0–8.5)
eGFR: 91 mL/min/{1.73_m2} (ref >59)

## 2021-04-07 LAB — CBC WITH DIFFERENTIAL/PLATELET
Basophils Absolute: 0.1 10*3/uL (ref 0.0–0.2)
Basos: 1 %
EOS (ABSOLUTE): 0.5 10*3/uL — ABNORMAL HIGH (ref 0.0–0.4)
Eos: 5 %
Hematocrit: 43.1 % (ref 34.0–46.6)
Hemoglobin: 14.8 g/dL (ref 11.1–15.9)
Immature Grans (Abs): 0 10*3/uL (ref 0.0–0.1)
Immature Granulocytes: 0 %
Lymphocytes Absolute: 2.6 10*3/uL (ref 0.7–3.1)
Lymphs: 26 %
MCH: 29.8 pg (ref 26.6–33.0)
MCHC: 34.3 g/dL (ref 31.5–35.7)
MCV: 87 fL (ref 79–97)
Monocytes Absolute: 0.6 10*3/uL (ref 0.1–0.9)
Monocytes: 6 %
Neutrophils Absolute: 6.2 10*3/uL (ref 1.4–7.0)
Neutrophils: 62 %
Platelets: 347 10*3/uL (ref 150–450)
RBC: 4.96 x10E6/uL (ref 3.77–5.28)
RDW: 12 % (ref 11.7–15.4)
WBC: 10 10*3/uL (ref 3.4–10.8)

## 2021-04-07 LAB — SEDIMENTATION RATE: Sed Rate: 24 mm/hr (ref 0–32)

## 2021-04-07 LAB — TSH+FREE T4
Free T4: 1.15 ng/dL (ref 0.82–1.77)
TSH: 1.71 u[IU]/mL (ref 0.450–4.500)

## 2021-04-07 LAB — C-REACTIVE PROTEIN: CRP: 2 mg/L (ref 0–10)

## 2021-04-07 LAB — B12 AND FOLATE PANEL
Folate: 8.7 ng/mL (ref >3.0)
Vitamin B-12: 244 pg/mL (ref 232–1245)

## 2021-04-07 LAB — VITAMIN D 25 HYDROXY (VIT D DEFICIENCY, FRACTURES): Vit D, 25-Hydroxy: 9.4 ng/mL — ABNORMAL LOW (ref 30.0–100.0)

## 2021-04-09 ENCOUNTER — Other Ambulatory Visit: Payer: Self-pay

## 2021-04-09 ENCOUNTER — Ambulatory Visit (INDEPENDENT_AMBULATORY_CARE_PROVIDER_SITE_OTHER): Payer: 59

## 2021-04-09 DIAGNOSIS — E538 Deficiency of other specified B group vitamins: Secondary | ICD-10-CM | POA: Diagnosis not present

## 2021-04-09 MED ORDER — CYANOCOBALAMIN 1000 MCG/ML IJ SOLN
1000.0000 ug | Freq: Once | INTRAMUSCULAR | Status: AC
  Administered 2021-04-09: 1000 ug via INTRAMUSCULAR

## 2021-04-13 ENCOUNTER — Encounter: Payer: Self-pay | Admitting: Podiatry

## 2021-04-13 ENCOUNTER — Other Ambulatory Visit: Payer: Self-pay | Admitting: Podiatry

## 2021-04-13 DIAGNOSIS — M722 Plantar fascial fibromatosis: Secondary | ICD-10-CM

## 2021-04-13 HISTORY — PX: FOOT SURGERY: SHX648

## 2021-04-13 MED ORDER — IBUPROFEN 800 MG PO TABS: 800.0000 mg | ORAL_TABLET | Freq: Four times a day (QID) | ORAL | 1 refills | Status: DC | PRN

## 2021-04-13 MED ORDER — OXYCODONE-ACETAMINOPHEN 5-325 MG PO TABS: 1.0000 | ORAL_TABLET | ORAL | 0 refills | Status: DC | PRN

## 2021-04-15 NOTE — Telephone Encounter (Signed)
error 

## 2021-04-17 LAB — COLOGUARD: COLOGUARD: NEGATIVE

## 2021-04-19 ENCOUNTER — Encounter: Payer: Self-pay | Admitting: Nurse Practitioner

## 2021-04-21 ENCOUNTER — Other Ambulatory Visit: Payer: Self-pay

## 2021-04-21 ENCOUNTER — Ambulatory Visit (INDEPENDENT_AMBULATORY_CARE_PROVIDER_SITE_OTHER): Payer: 59 | Admitting: Podiatry

## 2021-04-21 DIAGNOSIS — M722 Plantar fascial fibromatosis: Secondary | ICD-10-CM | POA: Diagnosis not present

## 2021-04-21 DIAGNOSIS — Z01818 Encounter for other preprocedural examination: Secondary | ICD-10-CM

## 2021-04-21 DIAGNOSIS — Z9889 Other specified postprocedural states: Secondary | ICD-10-CM

## 2021-04-21 NOTE — Progress Notes (Signed)
Subjective:  Patient ID: Allison Sherman, female    DOB: 23-Nov-1975,  MRN: [id number]  Chief Complaint  Patient presents with   Routine Post Op     POV #1 DOS 04/13/2021 RT EXCISION OF PLANTAR FIBROMS/SOFT TISSUE MASS    46 y.o. female presents with the above complaint.  Patient presents with follow-up of excision of right plantar fibroma.  She states she is doing well.  Minimal pain.  She is now here to discuss left side of the right side is very minimal in terms of pain.  She states is healing well as well.  She denies any other acute complaints.  She would like to discuss surgical options for the left side as continues to bother her is causing a lot of pain.   Review of Systems: Negative except as noted in the HPI. Denies N/V/F/Ch.  Past Medical History:  Diagnosis Date   A-fib ([place])    Anxiety    Depression    Renal disorder    kidney failure at 23    Current Outpatient Medications:    ergocalciferol (VITAMIN D2) 1.25 MG (50000 UT) capsule, Take 1 capsule (50,000 Units total) by mouth once a week., Disp: 4 capsule, Rfl: 5   ibuprofen (ADVIL) 800 MG tablet, Take 1 tablet (800 mg total) by mouth every 6 (six) hours as needed., Disp: 60 tablet, Rfl: 1   oxyCODONE-acetaminophen (PERCOCET) 5-325 MG tablet, Take 1 tablet by mouth every 4 (four) hours as needed for severe pain., Disp: 30 tablet, Rfl: 0   topiramate (TOPAMAX) 25 MG tablet, Take 1 tablet (25 mg total) by mouth daily., Disp: 30 tablet, Rfl: 2  Social History   Tobacco Use  Smoking Status Never  Smokeless Tobacco Never    No Known Allergies Objective:   There were no vitals filed for this visit.  There is no height or weight on file to calculate BMI. Constitutional Well developed. Well nourished.  Vascular Dorsalis pedis pulses palpable bilaterally. Posterior tibial pulses palpable bilaterally. Capillary refill normal to all digits.  No cyanosis or clubbing noted. Pedal hair growth normal.  Neurologic Normal  speech. Oriented to person, place, and time. Epicritic sensation to light touch grossly present bilaterally.  Dermatologic Left plantar fibroma x2 non mobile single lobulated indurated soft tissue mass to the plantar fascia consistent with plantar fibroma.  Negative transilluminates  Right side is healing well.  No signs of recurrence noted.  The incision is well coapted.  No dehiscence noted.  Orthopedic: Normal joint ROM without pain or crepitus bilaterally. No visible deformities. No bony tenderness.   Radiographs: None Assessment:   1. Plantar fascial fibromatosis   2. Encounter for preoperative examination for general surgical procedure   3. Status post right foot surgery     Plan:  Patient was evaluated and treated and all questions answered.  Right plantar fibroma status post plantar fibroma resection -Healing well.  No signs of Deis is noted.  No clinical signs of infection noted.  She is weightbearing as tolerated in the boot with more pressure to the heel.  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  Given the patient is healing well from the  right side.  We will plan on doing the left side.  I discussed with her she would need to wear a boot and be nonweightbearing to the left lower extremity she states understanding.  I discussed the recurrence rate of plantar fibroma which tends to be greater than 50% she states understanding and would like to proceed despite the recurrence -Informed surgical risk consent was reviewed and read aloud to the patient.  I reviewed the films.  I have discussed my findings with the  patient in great detail.  I have discussed all risks including but not limited to infection, stiffness, scarring, limp, disability, deformity, damage to blood vessels and nerves, numbness, poor healing, need for braces, arthritis, chronic pain, amputation, death.  All benefits and realistic expectations discussed in great detail.  I have made no promises as to the outcome.  I have provided realistic expectations.  I have offered the patient a 2nd opinion, which they have declined and assured me they preferred to proceed despite the risks    No follow-ups on file.

## 2021-04-24 ENCOUNTER — Telehealth: Payer: Self-pay

## 2021-04-24 NOTE — Telephone Encounter (Signed)
DOS 05/18/2021 ? ?PLANTAR FIBROMA LT - 28062 ? ?[facility] ? ?Larey Brick # [id number] GOOD FROM [birth year]/2[birth year] - 07/24/2021 ? ? ?

## 2021-04-29 ENCOUNTER — Other Ambulatory Visit: Payer: 59

## 2021-05-01 ENCOUNTER — Other Ambulatory Visit: Payer: Self-pay

## 2021-05-01 ENCOUNTER — Encounter: Payer: Self-pay | Admitting: Nurse Practitioner

## 2021-05-01 ENCOUNTER — Ambulatory Visit (INDEPENDENT_AMBULATORY_CARE_PROVIDER_SITE_OTHER): Payer: 59 | Admitting: Nurse Practitioner

## 2021-05-01 VITALS — BP 128/81 | HR 95 | Temp 98.8°F | Resp 16 | Ht 60.0 in | Wt 216.8 lb

## 2021-05-01 DIAGNOSIS — E559 Vitamin D deficiency, unspecified: Secondary | ICD-10-CM

## 2021-05-01 DIAGNOSIS — G43009 Migraine without aura, not intractable, without status migrainosus: Secondary | ICD-10-CM

## 2021-05-01 DIAGNOSIS — E538 Deficiency of other specified B group vitamins: Secondary | ICD-10-CM | POA: Diagnosis not present

## 2021-05-01 DIAGNOSIS — Z0001 Encounter for general adult medical examination with abnormal findings: Secondary | ICD-10-CM

## 2021-05-01 DIAGNOSIS — R300 Dysuria: Secondary | ICD-10-CM | POA: Diagnosis not present

## 2021-05-01 NOTE — Progress Notes (Signed)
[facility] [place], [place] [address]  Internal MEDICINE  Office Visit Note  Patient Name: Allison Sherman  [id number]  [id number]  Date of Service: 05/01/2021  Chief Complaint  Patient presents with   Annual Exam   Anxiety   Depression   Weight Loss    HPI [name] presents for an annual well visit and physical exam.  She is a well-appearing 46 year old female with vitamin D deficiency, B12 deficiency and migraines.  She did the Cologuard stool test for colorectal cancer screening and this was negative. 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  She is scheduled to have surgery on her left foot for plantar fibrosis on March 27. She is having trouble with weight loss and is interested in getting assistance with weight loss management.  Current weight is 216 pounds and her BMI is 42.34.  Her blood pressure is within normal limits and vital signs are stable. She had her labs drawn prior to her office visit today.  Patient was already made aware of her low vitamin D level and her low B12 level.  She has started taking a weekly prescription vitamin D supplement of 50,000 units and she has started getting B12 injections as previously discussed.  Her cholesterol levels are normal.  Her thyroid levels, CRP, BNP, sed rate, and folate levels are normal.  Her CBC and metabolic panel grossly normal except for a slightly elevated AST of 43 and CO2 of 17.    Current Medication: Outpatient Encounter Medications as of 05/01/2021  Medication Sig   ergocalciferol (VITAMIN D2) 1.25 MG (50000 UT) capsule Take 1 capsule (50,000 Units total) by mouth once a week.   ibuprofen (ADVIL) 800 MG tablet Take 1 tablet (800 mg total) by mouth every 6 (six) hours as needed.   topiramate (TOPAMAX) 25 MG tablet Take 1 tablet (25 mg total) by mouth daily.   [DISCONTINUED] oxyCODONE-acetaminophen (PERCOCET) 5-325 MG tablet Take 1 tablet  by mouth every 4 (four) hours as needed for severe pain.   [DISCONTINUED] promethazine (PHENERGAN) 12.5 MG tablet Take 1 tablet (12.5 mg total) by mouth every 6 (six) hours as needed for nausea or vomiting.   No facility-administered encounter medications on file as of 05/01/2021.    Surgical History: Past Surgical History:  Procedure Laterality Date   ABDOMINAL HYSTERECTOMY     partial   BLADDER SUSPENSION     FOOT SURGERY Right 04/13/2021   TONSILLECTOMY      Medical History: Past Medical History:  Diagnosis Date   A-fib ([place])    Anxiety    Depression    Renal disorder    kidney failure at 74    Family History: Family History  Problem Relation Age of Onset   Heart disease Mother    Arthritis Mother    Heart disease Father    Cancer Father    Arthritis Father    Breast cancer Paternal Aunt        20's   Arthritis Maternal Grandmother    Diabetes Maternal Grandfather    Arthritis Maternal Grandfather     Social History   Socioeconomic History   Marital status: Married    Spouse name: Not on file   Number of children: Not on file   Years of education: Not on file   Highest education level: Not on file  Occupational History   Not on file  Tobacco Use  Smoking status: Never   Smokeless tobacco: Never  Substance and Sexual Activity   Alcohol use: Yes    Comment: occasionally   Drug use: No   Sexual activity: Not on file  Other Topics Concern   Not on file  Social History Narrative   Not on file   Social Determinants of Health   Financial Resource Strain: Not on file  Food Insecurity: Not on file  Transportation Needs: Not on file  Physical Activity: Not on file  Stress: Not on file  Social Connections: Not on file  Intimate Partner Violence: Not on file      Review of Systems  Constitutional:  Negative for activity change, appetite change, chills, fatigue, fever and unexpected weight change.  HENT: Negative.  Negative for congestion, ear  pain, rhinorrhea, sore throat and trouble swallowing.   Eyes: Negative.   Respiratory: Negative.  Negative for cough, chest tightness, shortness of breath and wheezing.   Cardiovascular: Negative.  Negative for chest pain.  Gastrointestinal: Negative.  Negative for abdominal pain, blood in stool, constipation, diarrhea, nausea and vomiting.  Endocrine: Negative.   Genitourinary: Negative.  Negative for difficulty urinating, dysuria, frequency, hematuria and urgency.  Musculoskeletal: Negative.  Negative for arthralgias, back pain, joint swelling, myalgias and neck pain.  Skin: Negative.  Negative for rash and wound.  Allergic/Immunologic: Negative.  Negative for immunocompromised state.  Neurological: Negative.  Negative for dizziness, seizures, numbness and headaches.  Hematological: Negative.   Psychiatric/Behavioral:  Positive for depression. Negative for behavioral problems, self-injury and suicidal ideas. The patient is not nervous/anxious.    Vital Signs: BP 128/81    Pulse 95    Temp 98.8 F (37.1 C)    Resp 16    Ht 5' (1.524 m)    Wt 216 lb 12.8 oz (98.3 kg)    SpO2 97%    BMI 42.34 kg/m    Physical Exam Vitals reviewed.  Constitutional:      General: She is awake. She is not in acute distress.    Appearance: Normal appearance. She is well-developed and well-groomed. She is obese. She is not ill-appearing or diaphoretic.  HENT:     Head: Normocephalic and atraumatic.     Right Ear: Tympanic membrane, ear canal and external ear normal.     Left Ear: Tympanic membrane, ear canal and external ear normal.     Nose: Nose normal. No congestion or rhinorrhea.     Mouth/Throat:     Lips: Pink.     Mouth: Mucous membranes are moist.     Pharynx: Oropharynx is clear. Uvula midline. No oropharyngeal exudate or posterior oropharyngeal erythema.  Eyes:     General: Lids are normal. Vision grossly intact. Gaze aligned appropriately. No scleral icterus.       Right eye: No discharge.         Left eye: No discharge.     Extraocular Movements: Extraocular movements intact.     Conjunctiva/sclera: Conjunctivae normal.     Pupils: Pupils are equal, round, and reactive to light.     Funduscopic exam:    Right eye: Red reflex present.        Left eye: Red reflex present. Neck:     Thyroid: No thyromegaly.     Vascular: No JVD.     Trachea: No tracheal deviation.  Cardiovascular:     Rate and Rhythm: Normal rate and regular rhythm.     Pulses: Normal pulses.     Heart sounds: Normal  heart sounds, S1 normal and S2 normal. No murmur heard.   No friction rub. No gallop.  Pulmonary:     Effort: Pulmonary effort is normal. No accessory muscle usage or respiratory distress.     Breath sounds: Normal breath sounds and air entry. No stridor. No wheezing or rales.  Chest:     Chest wall: No tenderness.  Abdominal:     General: Bowel sounds are normal. There is no distension.     Palpations: Abdomen is soft. There is no shifting dullness, fluid wave, mass or pulsatile mass.     Tenderness: There is no abdominal tenderness. There is no guarding or rebound.  Musculoskeletal:        General: No tenderness or deformity. Normal range of motion.     Cervical back: Normal range of motion and neck supple.     Right lower leg: No edema.     Left lower leg: No edema.  Lymphadenopathy:     Cervical: No cervical adenopathy.  Skin:    General: Skin is warm and dry.     Capillary Refill: Capillary refill takes less than 2 seconds.     Coloration: Skin is not pale.     Findings: No erythema or rash.  Neurological:     Mental Status: She is alert and oriented to person, place, and time.     Cranial Nerves: No cranial nerve deficit.     Motor: No abnormal muscle tone.     Coordination: Coordination normal.     Deep Tendon Reflexes: Reflexes are normal and symmetric.  Psychiatric:        Mood and Affect: Mood and affect normal.        Behavior: Behavior normal. Behavior is cooperative.         Thought Content: Thought content normal.        Judgment: Judgment normal.       Assessment/Plan: 1. Encounter for routine adult health examination with abnormal findings Age-appropriate preventive screenings and vaccinations discussed, annual physical exam completed. Routine labs for health maintenance done prior to office visit and discussed with patient today. PHM updated.   2. Migraine without aura and without status migrainosus, not intractable Patient is taking topiramate 25 mg.  Patient reports that she has noticed a decrease in the number of migraines she has per week.  Continue as prescribed  3. Vitamin D deficiency Patient has significantly low vitamin D and has started taking the prescription vitamin D 50,000 unit capsule once weekly prior to today's office visit.  4. B12 deficiency Patient has started getting B12 injections as previously recommended and discussed due to low B12 level.  5. Dysuria Routine urinalysis done - UA/M w/rflx Culture, Routine      General Counseling: Jaylynne verbalizes understanding of the findings of todays visit and agrees with plan of treatment. I have discussed any further diagnostic evaluation that may be needed or ordered today. We also reviewed her medications today. she has been encouraged to call the office with any questions or concerns that should arise related to todays visit.    Orders Placed This Encounter  Procedures   UA/M w/rflx Culture, Routine    No orders of the defined types were placed in this encounter.   Return in about 2 months (around 07/01/2021) for F/U, Weight loss, Amory Simonetti PCP --do metabolic test at next visit.   Total time spent:30  Minutes Time spent includes review of chart, medications, test results, and follow up plan with the  patient.   [place] Controlled Substance Database was reviewed by me.  This patient was seen by Jonetta Osgood, FNP-C in collaboration with Dr. Clayborn Bigness as a part of  collaborative care agreement.  Tye Juarez R. Valetta Fuller, MSN, FNP-C Internal medicine

## 2021-05-02 LAB — MICROSCOPIC EXAMINATION

## 2021-05-04 ENCOUNTER — Encounter: Payer: Self-pay | Admitting: Nurse Practitioner

## 2021-05-04 LAB — UA/M W/RFLX CULTURE, ROUTINE
Bilirubin, UA: NEGATIVE
Glucose, UA: NEGATIVE
Ketones, UA: NEGATIVE
Leukocytes,UA: NEGATIVE
Nitrite, UA: NEGATIVE
Protein,UA: NEGATIVE
RBC, UA: NEGATIVE
Specific Gravity, UA: 1.018 (ref 1.005–1.030)
Urobilinogen, Ur: 0.2 mg/dL (ref 0.2–1.0)
pH, UA: 8.5 — ABNORMAL HIGH (ref 5.0–7.5)

## 2021-05-04 LAB — MICROSCOPIC EXAMINATION
Casts: NONE SEEN /lpf
Epithelial Cells (non renal): >10 /hpf — AB (ref 0–10)

## 2021-05-05 ENCOUNTER — Other Ambulatory Visit: Payer: Self-pay

## 2021-05-05 ENCOUNTER — Ambulatory Visit (INDEPENDENT_AMBULATORY_CARE_PROVIDER_SITE_OTHER): Payer: 59 | Admitting: Podiatry

## 2021-05-05 DIAGNOSIS — Z9889 Other specified postprocedural states: Secondary | ICD-10-CM

## 2021-05-05 DIAGNOSIS — M722 Plantar fascial fibromatosis: Secondary | ICD-10-CM

## 2021-05-05 NOTE — Progress Notes (Signed)
?Subjective:  ?Patient ID: Allison Sherman, female    DOB: 03-Feb-1976,  MRN: [id number] ? ?Chief Complaint  ?Patient presents with  ? Routine Post Op  ?  POV #2 DOS 04/13/2021 RT EXCISION OF PLANTAR FIBROMS/SOFT TISSUE MASS  ? ? ?46 y.o. female presents with the above complaint.  Patient presents for follow-up of right excision of plantar fibroma.  She states she is doing well.  She is ready get the stitches out.  She is scheduled for surgery in few weeks on the left side. ? ? ?Review of Systems: Negative except as noted in the HPI. Denies N/V/F/Ch. ? ?Past Medical History:  ?Diagnosis Date  ? A-fib ([place])   ? Anxiety   ? Depression   ? Renal disorder   ? kidney failure at 54  ? ? ?Current Outpatient Medications:  ?  ergocalciferol (VITAMIN D2) 1.25 MG (50000 UT) capsule, Take 1 capsule (50,000 Units total) by mouth once a week., Disp: 4 capsule, Rfl: 5 ?  ibuprofen (ADVIL) 800 MG tablet, Take 1 tablet (800 mg total) by mouth every 6 (six) hours as needed., Disp: 60 tablet, Rfl: 1 ?  topiramate (TOPAMAX) 25 MG tablet, Take 1 tablet (25 mg total) by mouth daily., Disp: 30 tablet, Rfl: 2 ? ?Social History  ? ?Tobacco Use  ?Smoking Status Never  ?Smokeless Tobacco Never  ? ? ?No Known Allergies ?Objective:  ? ?There were no vitals filed for this visit. ? ?There is no height or weight on file to calculate BMI. ?Constitutional Well developed. ?Well nourished.  ?Vascular Dorsalis pedis pulses palpable bilaterally. ?Posterior tibial pulses palpable bilaterally. ?Capillary refill normal to all digits.  ?No cyanosis or clubbing noted. ?Pedal hair growth normal.  ?Neurologic Normal speech. ?Oriented to person, place, and time. ?Epicritic sensation to light touch grossly present bilaterally.  ?Dermatologic Left plantar fibroma x2 non mobile single lobulated indurated soft tissue mass to the plantar fascia consistent with plantar fibroma.  Negative transilluminates ? ?Right side is healing well.  Skin has completely  epithelialized.  No signs of Deis is noted no complication noted.  No signs of recurrence noted.  ?Orthopedic: Normal joint ROM without pain or crepitus bilaterally. ?No visible deformities. ?No bony tenderness.  ? ?Radiographs: None ?Assessment:  ? ?1. Plantar fascial fibromatosis   ?2. Status post right foot surgery   ? ? ? ?Plan:  ?Patient was evaluated and treated and all questions answered. ? ?Right plantar fibroma status post plantar fibroma resection ?-Clinically healed and reepithelialized.  She can return to regular shoes without any reservation. ? 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  Given the patient is healing well from the right side.  We will plan on doing the left side.  I discussed with her she would need to wear a boot and be nonweightbearing to the left lower extremity she states understanding.  I discussed the recurrence rate of plantar fibroma which tends to be greater than 50% she states understanding and would like to proceed despite the recurrence ?-Informed surgical risk consent was reviewed and read aloud to the patient.  I reviewed the films.  I have discussed my findings with the patient in great  detail.  I have discussed all risks including but not limited to infection, stiffness, scarring, limp, disability, deformity, damage to blood vessels and nerves, numbness, poor healing, need for braces, arthritis, chronic pain, amputation, death.  All benefits and realistic expectations discussed in great detail.  I have made no promises as to the outcome.  I have provided realistic expectations.  I have offered the patient  a 2nd opinion, which they have declined and assured me they preferred to proceed despite the risks ? ? ? ?No follow-ups on file. ?

## 2021-05-07 ENCOUNTER — Other Ambulatory Visit: Payer: Self-pay

## 2021-05-07 ENCOUNTER — Encounter: Payer: Self-pay | Admitting: Nurse Practitioner

## 2021-05-07 ENCOUNTER — Ambulatory Visit: Payer: 59

## 2021-05-07 MED ORDER — SUMATRIPTAN SUCCINATE 50 MG PO TABS: 50.0000 mg | ORAL_TABLET | ORAL | 0 refills | Status: DC | PRN

## 2021-05-07 MED ORDER — "BD LUER-LOK SYRINGE 25G X 1"" 3 ML MISC": 0 refills | Status: DC

## 2021-05-07 MED ORDER — CYANOCOBALAMIN 1000 MCG/ML IJ SOLN: 1000.0000 ug | INTRAMUSCULAR | 0 refills | Status: DC

## 2021-05-08 MED ORDER — DULOXETINE HCL 30 MG PO CPEP: 30.0000 mg | ORAL_CAPSULE | Freq: Every day | ORAL | 1 refills | Status: DC

## 2021-05-18 ENCOUNTER — Other Ambulatory Visit: Payer: Self-pay | Admitting: Podiatry

## 2021-05-18 ENCOUNTER — Encounter: Payer: Self-pay | Admitting: Podiatry

## 2021-05-18 DIAGNOSIS — M722 Plantar fascial fibromatosis: Secondary | ICD-10-CM | POA: Diagnosis not present

## 2021-05-18 MED ORDER — IBUPROFEN 800 MG PO TABS: 800.0000 mg | ORAL_TABLET | Freq: Four times a day (QID) | ORAL | 1 refills | Status: DC | PRN

## 2021-05-18 MED ORDER — OXYCODONE-ACETAMINOPHEN 5-325 MG PO TABS: 1.0000 | ORAL_TABLET | ORAL | 0 refills | Status: DC | PRN

## 2021-05-19 ENCOUNTER — Telehealth: Payer: Self-pay

## 2021-05-19 NOTE — Telephone Encounter (Signed)
Patient called stated that you done surgery yesterday and Percocet 59m was sent to pharmacy.  She stated that she is having a lot more pain than her last surgery and was wanting to know if you could call in something a little stronger.  Please advise ?

## 2021-05-20 MED ORDER — OXYCODONE-ACETAMINOPHEN 10-325 MG PO TABS: 1.0000 | ORAL_TABLET | ORAL | 0 refills | Status: DC | PRN

## 2021-05-26 ENCOUNTER — Ambulatory Visit: Payer: Medicaid Other | Admitting: Dermatology

## 2021-05-26 ENCOUNTER — Ambulatory Visit (INDEPENDENT_AMBULATORY_CARE_PROVIDER_SITE_OTHER): Payer: 59 | Admitting: Podiatry

## 2021-05-26 DIAGNOSIS — M722 Plantar fascial fibromatosis: Secondary | ICD-10-CM

## 2021-05-26 DIAGNOSIS — Z9889 Other specified postprocedural states: Secondary | ICD-10-CM

## 2021-05-27 NOTE — Progress Notes (Signed)
?Subjective:  ?Patient ID: Allison Sherman, female    DOB: Dec 02, 1975,  MRN: [id number] ? ?Chief Complaint  ?Patient presents with  ? Routine Post Op  ?  POV #1 DOS 05/18/2021 plantar fascial fibromatosis   ? ? ?DOS: 05/18/2021 ?Procedure: Excision of left plantar fibroma's ? ?46 y.o. female returns for post-op check.  Patient states she is doing well.  No acute complaints minimal pain.  She states she is healing well.  Has been off of the foot.  She denies any other acute issues. ? ?Review of Systems: Negative except as noted in the HPI. Denies N/V/F/Ch. ? ?Past Medical History:  ?Diagnosis Date  ? A-fib ([place])   ? Anxiety   ? Depression   ? Renal disorder   ? kidney failure at 47  ? ? ?Current Outpatient Medications:  ?  cyanocobalamin (,VITAMIN B-12,) 1000 MCG/ML injection, Inject 1 mL (1,000 mcg total) into the muscle every 30 (thirty) days., Disp: 3 mL, Rfl: 0 ?  DULoxetine (CYMBALTA) 30 MG capsule, Take 1 capsule (30 mg total) by mouth daily., Disp: 30 capsule, Rfl: 1 ?  ergocalciferol (VITAMIN D2) 1.25 MG (50000 UT) capsule, Take 1 capsule (50,000 Units total) by mouth once a week., Disp: 4 capsule, Rfl: 5 ?  ibuprofen (ADVIL) 800 MG tablet, Take 1 tablet (800 mg total) by mouth every 6 (six) hours as needed., Disp: 60 tablet, Rfl: 1 ?  ibuprofen (ADVIL) 800 MG tablet, Take 1 tablet (800 mg total) by mouth every 6 (six) hours as needed., Disp: 60 tablet, Rfl: 1 ?  oxyCODONE-acetaminophen (PERCOCET) 10-325 MG tablet, Take 1 tablet by mouth every 4 (four) hours as needed for pain., Disp: 30 tablet, Rfl: 0 ?  oxyCODONE-acetaminophen (PERCOCET) 5-325 MG tablet, Take 1 tablet by mouth every 4 (four) hours as needed for severe pain., Disp: 30 tablet, Rfl: 0 ?  SUMAtriptan (IMITREX) 50 MG tablet, Take 1 tablet (50 mg total) by mouth every 2 (two) hours as needed for migraine. May repeat in 2 hours if headache persists or recurs. Max dose per 24 hours:100 mg, Disp: 10 tablet, Rfl: 0 ?  SYRINGE-NEEDLE, DISP, 3 ML (B-D  3CC LUER-LOK SYR 25GX1") 25G X 1" 3 ML MISC, Use as directed to inject vitamin b-12 into muscle once a month, Disp: 3 each, Rfl: 0 ?  topiramate (TOPAMAX) 25 MG tablet, Take 1 tablet (25 mg total) by mouth daily., Disp: 30 tablet, Rfl: 2 ? ?Social History  ? ?Tobacco Use  ?Smoking Status Never  ?Smokeless Tobacco Never  ? ? ?No Known Allergies ?Objective:  ?There were no vitals filed for this visit. ?There is no height or weight on file to calculate BMI. ?Constitutional Well developed. ?Well nourished.  ?Vascular Foot warm and well perfused. ?Capillary refill normal to all digits.   ?Neurologic Normal speech. ?Oriented to person, place, and time. ?Epicritic sensation to light touch grossly present bilaterally.  ?Dermatologic Skin healing well without signs of infection. Skin edges well coapted without signs of infection.  ?Orthopedic: Tenderness to palpation noted about the surgical site.  ? ?Radiographs: None ?Assessment:  ? ?1. Plantar fascial fibromatosis   ?2. Status post left foot surgery   ? ?Plan:  ?Patient was evaluated and treated and all questions answered. ? ?S/p foot surgery left ?-Progressing as expected post-operatively. ?-XR: None ?-WB Status: Nonweightbearing to the left lower extremity in crutches ?-Sutures: Removed.  No clinical signs of Deis is noted.  No complication noted. ?-Medications: None ?-Foot redressed. ? ?No follow-ups  on file.  ?

## 2021-05-28 ENCOUNTER — Encounter: Payer: Self-pay | Admitting: Podiatry

## 2021-06-02 ENCOUNTER — Ambulatory Visit: Payer: 59 | Admitting: Nurse Practitioner

## 2021-06-09 ENCOUNTER — Encounter: Payer: 59 | Admitting: Podiatry

## 2021-06-11 ENCOUNTER — Ambulatory Visit: Payer: 59

## 2021-06-29 ENCOUNTER — Ambulatory Visit: Payer: 59 | Admitting: Nurse Practitioner

## 2021-07-13 ENCOUNTER — Other Ambulatory Visit: Payer: Self-pay | Admitting: Nurse Practitioner

## 2021-07-13 MED ORDER — "BD LUER-LOK SYRINGE 25G X 1"" 3 ML MISC": 1 refills | Status: AC

## 2021-07-13 MED ORDER — CYANOCOBALAMIN 1000 MCG/ML IJ SOLN: 1000.0000 ug | INTRAMUSCULAR | 1 refills | Status: AC

## 2021-08-27 ENCOUNTER — Ambulatory Visit (INDEPENDENT_AMBULATORY_CARE_PROVIDER_SITE_OTHER): Payer: 59 | Admitting: Nurse Practitioner

## 2021-08-27 ENCOUNTER — Encounter: Payer: Self-pay | Admitting: Nurse Practitioner

## 2021-08-27 VITALS — BP 123/86 | HR 98 | Temp 98.8°F | Resp 16 | Ht 60.0 in | Wt 212.2 lb

## 2021-08-27 DIAGNOSIS — G43009 Migraine without aura, not intractable, without status migrainosus: Secondary | ICD-10-CM

## 2021-08-27 DIAGNOSIS — G4709 Other insomnia: Secondary | ICD-10-CM

## 2021-08-27 DIAGNOSIS — F419 Anxiety disorder, unspecified: Secondary | ICD-10-CM

## 2021-08-27 MED ORDER — DULOXETINE HCL 60 MG PO CPEP: 60.0000 mg | ORAL_CAPSULE | Freq: Every day | ORAL | 2 refills | Status: AC

## 2021-08-27 MED ORDER — TOPIRAMATE 25 MG PO TABS: 25.0000 mg | ORAL_TABLET | Freq: Two times a day (BID) | ORAL | 1 refills | Status: DC

## 2021-08-27 MED ORDER — SUMATRIPTAN SUCCINATE 100 MG PO TABS: 100.0000 mg | ORAL_TABLET | ORAL | 0 refills | Status: AC | PRN

## 2021-08-27 NOTE — Progress Notes (Signed)
[facility] [address] [place], [place] [address]  Internal MEDICINE  Office Visit Note  Patient Name: Allison Sherman  [id number]  [id number]  Date of Service: 08/27/2021  Chief Complaint  Patient presents with   Follow-up    Discuss meds, pain in upper abd sometimes, blood in stool twice and black stool once, started about 2 weeks ago   Depression   Anxiety   Medication Refill   Headache    Feels like someone hit her in head, nausea and light headed, started about 2 weeks ago     HPI Allison Sherman presents for a follow-up visit for medication review, GI problems, headache, and anxiety and depression. --Headache: She feels like she hit her head, describes as throbbing.  Reports lightheadedness, nausea, vomiting, vertigo, movement aggravates it.  Denies sensitivity to light or sound. --Also reports difficulty concentrating, fatigue, and insomnia. --GI symptoms: Has pain in the upper abdomen that comes and goes, has seen blood in stool 2 times and has had black stool once, this started 2 weeks ago. May be related to migraines Lost 4 lbs since previous office visit, stopped drinking soda, working on diet, drinking more water.     Current Medication: Outpatient Encounter Medications as of 08/27/2021  Medication Sig   DULoxetine (CYMBALTA) 60 MG capsule Take 1 capsule (60 mg total) by mouth daily.   SUMAtriptan (IMITREX) 100 MG tablet Take 1 tablet (100 mg total) by mouth every 2 (two) hours as needed for migraine. May repeat in 2 hours if headache persists or recurs. No more than 2 tablets per 24 hours.   topiramate (TOPAMAX) 25 MG tablet Take 1 tablet (25 mg total) by mouth 2 (two) times daily.   cyanocobalamin (,VITAMIN B-12,) 1000 MCG/ML injection Inject 1 mL (1,000 mcg total) into the muscle every 30 (thirty) days.   ergocalciferol (VITAMIN D2) 1.25 MG (50000 UT) capsule Take 1 capsule (50,000 Units total) by mouth once a week.   ibuprofen (ADVIL) 800 MG tablet Take 1 tablet  (800 mg total) by mouth every 6 (six) hours as needed.   ibuprofen (ADVIL) 800 MG tablet Take 1 tablet (800 mg total) by mouth every 6 (six) hours as needed.   SYRINGE-NEEDLE, DISP, 3 ML (B-D 3CC LUER-LOK SYR 25GX1") 25G X 1" 3 ML MISC Use as directed to inject vitamin b-12 into muscle once a month   [DISCONTINUED] DULoxetine (CYMBALTA) 30 MG capsule Take 1 capsule (30 mg total) by mouth daily.   [DISCONTINUED] oxyCODONE-acetaminophen (PERCOCET) 10-325 MG tablet Take 1 tablet by mouth every 4 (four) hours as needed for pain.   [DISCONTINUED] oxyCODONE-acetaminophen (PERCOCET) 5-325 MG tablet Take 1 tablet by mouth every 4 (four) hours as needed for severe pain.   [DISCONTINUED] promethazine (PHENERGAN) 12.5 MG tablet Take 1 tablet (12.5 mg total) by mouth every 6 (six) hours as needed for nausea or vomiting.   [DISCONTINUED] SUMAtriptan (IMITREX) 50 MG tablet Take 1 tablet (50 mg total) by mouth every 2 (two) hours as needed for migraine. May repeat in 2 hours if headache persists or recurs. Max dose per 24 hours:100 mg   [DISCONTINUED] topiramate (TOPAMAX) 25 MG tablet Take 1 tablet (25 mg total) by mouth daily.   No facility-administered encounter medications on file as of 08/27/2021.    Surgical History: Past Surgical History:  Procedure Laterality Date   ABDOMINAL HYSTERECTOMY     partial   BLADDER SUSPENSION     FOOT SURGERY Right 04/13/2021   TONSILLECTOMY  Medical History: Past Medical History:  Diagnosis Date   A-fib (HCC)    Anxiety    Depression    Renal disorder    kidney failure at 13    Family History: Family History  Problem Relation Age of Onset   Heart disease Mother    Arthritis Mother    Heart disease Father    Cancer Father    Arthritis Father    Breast cancer Paternal Aunt        20's   Arthritis Maternal Grandmother    Diabetes Maternal Grandfather    Arthritis Maternal Grandfather     Social History   Socioeconomic History   Marital status:  Married    Spouse name: Not on file   Number of children: Not on file   Years of education: Not on file   Highest education level: Not on file  Occupational History   Not on file  Tobacco Use   Smoking status: Never   Smokeless tobacco: Never  Substance and Sexual Activity   Alcohol use: Yes    Comment: occasionally   Drug use: No   Sexual activity: Not on file  Other Topics Concern   Not on file  Social History Narrative   Not on file   Social Determinants of Health   Financial Resource Strain: Not on file  Food Insecurity: Not on file  Transportation Needs: Not on file  Physical Activity: Not on file  Stress: Not on file  Social Connections: Not on file  Intimate Partner Violence: Not on file      Review of Systems  Constitutional:  Negative for chills, fatigue and unexpected weight change.  HENT:  Negative for congestion, rhinorrhea, sneezing and sore throat.   Eyes:  Negative for redness.  Respiratory:  Negative for cough, chest tightness and shortness of breath.   Cardiovascular:  Negative for chest pain and palpitations.  Gastrointestinal:  Positive for nausea and vomiting. Negative for abdominal pain, constipation and diarrhea.  Genitourinary:  Negative for dysuria and frequency.  Musculoskeletal:  Positive for arthralgias. Negative for back pain, joint swelling and neck pain.  Neurological:  Positive for dizziness, light-headedness and headaches. Negative for tremors and numbness.  Hematological:  Negative for adenopathy. Does not bruise/bleed easily.  Psychiatric/Behavioral:  Negative for behavioral problems (Depression), sleep disturbance and suicidal ideas. The patient is nervous/anxious.     Vital Signs: BP 123/86   Pulse 98   Temp 98.8 F (37.1 C)   Resp 16   Ht 5' (1.524 m)   Wt 212 lb 3.2 oz (96.3 kg)   SpO2 98%   BMI 41.44 kg/m    Physical Exam Vitals reviewed.  Constitutional:      General: She is not in acute distress.    Appearance:  Normal appearance. She is obese. She is not ill-appearing.  HENT:     Head: Normocephalic and atraumatic.  Eyes:     Pupils: Pupils are equal, round, and reactive to light.  Cardiovascular:     Rate and Rhythm: Normal rate and regular rhythm.     Heart sounds: Normal heart sounds. No murmur heard. Pulmonary:     Effort: Pulmonary effort is normal. No respiratory distress.     Breath sounds: Normal breath sounds. No wheezing.  Neurological:     Mental Status: She is alert and oriented to person, place, and time.  Psychiatric:        Mood and Affect: Mood normal.  Behavior: Behavior normal.        Assessment/Plan: 1. Migraine without aura and without status migrainosus, not intractable Topiramate prescribed for migraine prevention and sumatriptan and prescribed for acute migraine follow-up in 1 month - topiramate (TOPAMAX) 25 MG tablet; Take 1 tablet (25 mg total) by mouth 2 (two) times daily.  Dispense: 180 tablet; Refill: 1 - SUMAtriptan (IMITREX) 100 MG tablet; Take 1 tablet (100 mg total) by mouth every 2 (two) hours as needed for migraine. May repeat in 2 hours if headache persists or recurs. No more than 2 tablets per 24 hours.  Dispense: 10 tablet; Refill: 0  2. Other insomnia We will try trazodone at bedtime for insomnia - traZODone (DESYREL) 50 MG tablet; Take 0.5-1 tablets (25-50 mg total) by mouth at bedtime as needed for sleep.  Dispense: 30 tablet; Refill: 3  3. Anxiety Duloxetine dose increased to 60 mg daily follow-up in 1 month - DULoxetine (CYMBALTA) 60 MG capsule; Take 1 capsule (60 mg total) by mouth daily.  Dispense: 30 capsule; Refill: 2   General Counseling: Coby verbalizes understanding of the findings of todays visit and agrees with plan of treatment. I have discussed any further diagnostic evaluation that may be needed or ordered today. We also reviewed her medications today. she has been encouraged to call the office with any questions or concerns  that should arise related to todays visit.    No orders of the defined types were placed in this encounter.   Meds ordered this encounter  Medications   topiramate (TOPAMAX) 25 MG tablet    Sig: Take 1 tablet (25 mg total) by mouth 2 (two) times daily.    Dispense:  180 tablet    Refill:  1   SUMAtriptan (IMITREX) 100 MG tablet    Sig: Take 1 tablet (100 mg total) by mouth every 2 (two) hours as needed for migraine. May repeat in 2 hours if headache persists or recurs. No more than 2 tablets per 24 hours.    Dispense:  10 tablet    Refill:  0   DULoxetine (CYMBALTA) 60 MG capsule    Sig: Take 1 capsule (60 mg total) by mouth daily.    Dispense:  30 capsule    Refill:  2    Return in about 4 weeks (around 09/24/2021) for F/U med dose adjustments, Chandelle Harkey PCP.   Total time spent:30 Minutes Time spent includes review of chart, medications, test results, and follow up plan with the patient.   [place] Controlled Substance Database was reviewed by me.  This patient was seen by Sallyanne Kuster, FNP-C in collaboration with Dr. Beverely Risen as a part of collaborative care agreement.   Daunte Oestreich R. Tedd Sias, MSN, FNP-C Internal medicine

## 2021-09-04 ENCOUNTER — Telehealth: Payer: Self-pay

## 2021-09-04 MED ORDER — TRAZODONE HCL 50 MG PO TABS: 25.0000 mg | ORAL_TABLET | Freq: Every evening | ORAL | 3 refills | Status: AC | PRN

## 2021-09-04 NOTE — Telephone Encounter (Signed)
Spoke to pt and informed her med was sent

## 2021-09-24 ENCOUNTER — Ambulatory Visit: Payer: 59 | Admitting: Nurse Practitioner

## 2021-10-01 ENCOUNTER — Ambulatory Visit: Payer: 59 | Admitting: Nurse Practitioner

## 2021-10-11 ENCOUNTER — Encounter: Payer: Self-pay | Admitting: Nurse Practitioner

## 2022-01-01 ENCOUNTER — Ambulatory Visit: Payer: Self-pay

## 2022-01-20 ENCOUNTER — Ambulatory Visit: Payer: BLUE CROSS/BLUE SHIELD | Admitting: Nurse Practitioner

## 2022-04-16 DIAGNOSIS — F419 Anxiety disorder, unspecified: Secondary | ICD-10-CM | POA: Diagnosis present

## 2022-05-05 ENCOUNTER — Telehealth: Payer: Self-pay | Admitting: Internal Medicine

## 2022-05-05 ENCOUNTER — Encounter: Payer: BLUE CROSS/BLUE SHIELD | Admitting: Nurse Practitioner

## 2022-05-05 NOTE — Telephone Encounter (Signed)
Patient discharged from practice due to appointment noncompliance. Letter mailed-Allison Sherman

## 2023-04-06 DIAGNOSIS — N12 Tubulo-interstitial nephritis, not specified as acute or chronic: Secondary | ICD-10-CM | POA: Diagnosis present

## 2023-09-14 ENCOUNTER — Emergency Department

## 2023-09-14 ENCOUNTER — Other Ambulatory Visit: Payer: Self-pay

## 2023-09-14 ENCOUNTER — Emergency Department
Admission: EM | Admit: 2023-09-14 | Discharge: 2023-09-14 | Disposition: A | Discharge status: Home / Self Care | Attending: Emergency Medicine | Admitting: Emergency Medicine

## 2023-09-14 DIAGNOSIS — R1030 Lower abdominal pain, unspecified: Secondary | ICD-10-CM | POA: Diagnosis not present

## 2023-09-14 DIAGNOSIS — R079 Chest pain, unspecified: Secondary | ICD-10-CM | POA: Insufficient documentation

## 2023-09-14 DIAGNOSIS — S161XXA Strain of muscle, fascia and tendon at neck level, initial encounter: Secondary | ICD-10-CM | POA: Diagnosis not present

## 2023-09-14 DIAGNOSIS — M25512 Pain in left shoulder: Secondary | ICD-10-CM | POA: Diagnosis not present

## 2023-09-14 DIAGNOSIS — R519 Headache, unspecified: Secondary | ICD-10-CM | POA: Diagnosis not present

## 2023-09-14 DIAGNOSIS — S199XXA Unspecified injury of neck, initial encounter: Secondary | ICD-10-CM | POA: Diagnosis present

## 2023-09-14 DIAGNOSIS — M791 Myalgia, unspecified site: Secondary | ICD-10-CM | POA: Diagnosis not present

## 2023-09-14 DIAGNOSIS — Y92410 Unspecified street and highway as the place of occurrence of the external cause: Secondary | ICD-10-CM | POA: Insufficient documentation

## 2023-09-14 DIAGNOSIS — M7918 Myalgia, other site: Secondary | ICD-10-CM

## 2023-09-14 LAB — CBC WITH DIFFERENTIAL/PLATELET
Abs Immature Granulocytes: 0.03 K/uL (ref 0.00–0.07)
Basophils Absolute: 0.1 K/uL (ref 0.0–0.1)
Basophils Relative: 1 %
Eosinophils Absolute: 0.3 K/uL (ref 0.0–0.5)
Eosinophils Relative: 3 %
HCT: 45.6 % (ref 36.0–46.0)
Hemoglobin: 15.2 g/dL — ABNORMAL HIGH (ref 12.0–15.0)
Immature Granulocytes: 0 %
Lymphocytes Relative: 18 %
Lymphs Abs: 1.8 K/uL (ref 0.7–4.0)
MCH: 29.9 pg (ref 26.0–34.0)
MCHC: 33.3 g/dL (ref 30.0–36.0)
MCV: 89.6 fL (ref 80.0–100.0)
Monocytes Absolute: 0.6 K/uL (ref 0.1–1.0)
Monocytes Relative: 6 %
Neutro Abs: 7.5 K/uL (ref 1.7–7.7)
Neutrophils Relative %: 72 %
Platelets: 342 K/uL (ref 150–400)
RBC: 5.09 MIL/uL (ref 3.87–5.11)
RDW: 13 % (ref 11.5–15.5)
WBC: 10.4 K/uL (ref 4.0–10.5)
nRBC: 0 % (ref 0.0–0.2)

## 2023-09-14 LAB — URINALYSIS, ROUTINE W REFLEX MICROSCOPIC
Bilirubin Urine: NEGATIVE
Glucose, UA: NEGATIVE mg/dL
Hgb urine dipstick: NEGATIVE
Ketones, ur: NEGATIVE mg/dL
Nitrite: NEGATIVE
Protein, ur: NEGATIVE mg/dL
Specific Gravity, Urine: 1.005 (ref 1.005–1.030)
pH: 7 (ref 5.0–8.0)

## 2023-09-14 LAB — COMPREHENSIVE METABOLIC PANEL WITH GFR
ALT: 21 U/L (ref 0–44)
AST: 26 U/L (ref 15–41)
Albumin: 3.7 g/dL (ref 3.5–5.0)
Alkaline Phosphatase: 72 U/L (ref 38–126)
Anion gap: 11 (ref 5–15)
BUN: 13 mg/dL (ref 6–20)
CO2: 23 mmol/L (ref 22–32)
Calcium: 10.1 mg/dL (ref 8.9–10.3)
Chloride: 107 mmol/L (ref 98–111)
Creatinine, Ser: 0.94 mg/dL (ref 0.44–1.00)
GFR, Estimated: >60 mL/min (ref >60)
Glucose, Bld: 107 mg/dL — ABNORMAL HIGH (ref 70–99)
Potassium: 3.7 mmol/L (ref 3.5–5.1)
Sodium: 141 mmol/L (ref 135–145)
Total Bilirubin: 0.8 mg/dL (ref 0.0–1.2)
Total Protein: 7 g/dL (ref 6.5–8.1)

## 2023-09-14 MED ORDER — MELOXICAM 15 MG PO TABS: 15.0000 mg | ORAL_TABLET | Freq: Every day | ORAL | 0 refills | Status: DC

## 2023-09-14 MED ORDER — MORPHINE SULFATE (PF) 4 MG/ML IV SOLN
4.0000 mg | Freq: Once | INTRAVENOUS | Status: AC
  Administered 2023-09-14: 4 mg via INTRAVENOUS
  Filled 2023-09-14 (×2): qty 1

## 2023-09-14 MED ORDER — ONDANSETRON HCL 4 MG/2ML IJ SOLN
4.0000 mg | Freq: Once | INTRAMUSCULAR | Status: AC
  Administered 2023-09-14: 4 mg via INTRAVENOUS
  Filled 2023-09-14 (×2): qty 2

## 2023-09-14 MED ORDER — IOHEXOL 300 MG/ML  SOLN
100.0000 mL | Freq: Once | INTRAMUSCULAR | Status: AC | PRN
  Administered 2023-09-14: 100 mL via INTRAVENOUS

## 2023-09-14 MED ORDER — BACLOFEN 10 MG PO TABS: 10.0000 mg | ORAL_TABLET | Freq: Three times a day (TID) | ORAL | 0 refills | Status: DC
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
## 2023-09-15 ENCOUNTER — Telehealth: Admitting: Physician Assistant

## 2023-09-15 DIAGNOSIS — M791 Myalgia, unspecified site: Secondary | ICD-10-CM | POA: Diagnosis not present

## 2023-09-15 DIAGNOSIS — M542 Cervicalgia: Secondary | ICD-10-CM

## 2023-09-15 MED ORDER — ETODOLAC 200 MG PO CAPS: 200.0000 mg | ORAL_CAPSULE | Freq: Three times a day (TID) | ORAL | 0 refills | Status: DC

## 2023-09-15 MED ORDER — CYCLOBENZAPRINE HCL 10 MG PO TABS
10.0000 mg | ORAL_TABLET | Freq: Three times a day (TID) | ORAL | 0 refills | Status: AC | PRN
Start: 2023-09-15 — End: ?

## 2023-09-15 NOTE — Patient Instructions (Signed)
 Allison Sherman, thank you for joining [name], PA-C for today's virtual visit.  While this provider is not your primary care provider (PCP), if your PCP is located in our provider database this encounter information will be shared with them immediately following your visit.   A [facility] MyChart account gives you access to today's visit and all your visits, tests, and labs performed at [facility]  click here if you don't have a [facility] MyChart account or go to [email or web address]  Consent: (Patient) Allison Sherman provided verbal consent for this virtual visit at the beginning of the encounter.  Current Medications:  Current Outpatient Medications:    baclofen  (LIORESAL ) 10 MG tablet, Take 1 tablet (10 mg total) by mouth 3 (three) times daily for 7 days., Disp: 21 tablet, Rfl: 0   cyanocobalamin  (,VITAMIN B-12,) 1000 MCG/ML injection, Inject 1 mL (1,000 mcg total) into the muscle every 30 (thirty) days., Disp: 3 mL, Rfl: 1   DULoxetine  (CYMBALTA ) 60 MG capsule, Take 1 capsule (60 mg total) by mouth daily., Disp: 30 capsule, Rfl: 2   ergocalciferol  (VITAMIN D2) 1.25 MG (50000 UT) capsule, Take 1 capsule (50,000 Units total) by mouth once a week., Disp: 4 capsule, Rfl: 5   ibuprofen  (ADVIL ) 800 MG tablet, Take 1 tablet (800 mg total) by mouth every 6 (six) hours as needed., Disp: 60 tablet, Rfl: 1   ibuprofen  (ADVIL ) 800 MG tablet, Take 1 tablet (800 mg total) by mouth every 6 (six) hours as needed., Disp: 60 tablet, Rfl: 1   meloxicam  (MOBIC ) 15 MG tablet, Take 1 tablet (15 mg total) by mouth daily., Disp: 30 tablet, Rfl: 0   SUMAtriptan  (IMITREX ) 100 MG tablet, Take 1 tablet (100 mg total) by mouth every 2 (two) hours as needed for migraine. May repeat in 2 hours if headache persists or recurs. No more than 2 tablets per 24 hours., Disp: 10 tablet, Rfl: 0   SYRINGE-NEEDLE, DISP, 3 ML (B-D 3CC LUER-LOK SYR 25GX1) 25G X 1 3 ML MISC, Use as directed to inject  vitamin b-12 into muscle once a month, Disp: 3 each, Rfl: 1   topiramate  (TOPAMAX ) 25 MG tablet, Take 1 tablet (25 mg total) by mouth 2 (two) times daily., Disp: 180 tablet, Rfl: 1   traZODone  (DESYREL ) 50 MG tablet, Take 0.5-1 tablets (25-50 mg total) by mouth at bedtime as needed for sleep., Disp: 30 tablet, Rfl: 3   Medications ordered in this encounter:  No orders of the defined types were placed in this encounter.    *If you need refills on other medications prior to your next appointment, please contact your pharmacy*  Follow-Up: Call back or seek an in-person evaluation if the symptoms worsen or if the condition fails to improve as anticipated.  [facility] [phone]  Other Instructions Continue to rest and start use of heating pad.  OTC Tylenol  Extra Strength as discussed. Stop Baclofen  and Meloxicam . Will change medications to Cyclobenzaprine  10 mg up to three time daily as needed, and Etodolac  200 mg every 8 hours as needed.  If you note any non-resolving, new, or worsening symptoms despite treatment, please seek an in-person evaluation ASAP.    If you have been instructed to have an in-person evaluation today at a local Urgent Care facility, please use the link below. It will take you to a list of all of our available [facility], including address, phone number and hours of operation. Please do  not delay care.  [facility]  If you or a family member do not have a primary care provider, use the link below to schedule a visit and establish care. When you choose a [facility] primary care physician or advanced practice provider, you gain a long-term partner in health. Find a Primary Care Provider  Learn more about [facility]'s in-office and virtual care options: [facility] Get Care Now
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
## 2023-10-30 ENCOUNTER — Inpatient Hospital Stay: Admitting: Certified Registered"

## 2023-10-30 ENCOUNTER — Inpatient Hospital Stay

## 2023-10-30 ENCOUNTER — Encounter
Admission: EM | Disposition: A | Discharge status: Home / Self Care | Payer: Self-pay | Source: Home / Self Care | Attending: Osteopathic Medicine

## 2023-10-30 ENCOUNTER — Other Ambulatory Visit: Payer: Self-pay

## 2023-10-30 ENCOUNTER — Emergency Department

## 2023-10-30 ENCOUNTER — Inpatient Hospital Stay
Admission: EM | Admit: 2023-10-30 | Discharge: 2023-11-01 | DRG: 854 | Disposition: A | Discharge status: Home / Self Care | Attending: Osteopathic Medicine | Admitting: Osteopathic Medicine

## 2023-10-30 DIAGNOSIS — F419 Anxiety disorder, unspecified: Secondary | ICD-10-CM | POA: Diagnosis present

## 2023-10-30 DIAGNOSIS — N201 Calculus of ureter: Principal | ICD-10-CM

## 2023-10-30 DIAGNOSIS — A419 Sepsis, unspecified organism: Secondary | ICD-10-CM | POA: Diagnosis present

## 2023-10-30 DIAGNOSIS — F32A Depression, unspecified: Secondary | ICD-10-CM | POA: Diagnosis present

## 2023-10-30 DIAGNOSIS — N179 Acute kidney failure, unspecified: Secondary | ICD-10-CM | POA: Diagnosis present

## 2023-10-30 DIAGNOSIS — Z1152 Encounter for screening for COVID-19: Secondary | ICD-10-CM

## 2023-10-30 DIAGNOSIS — R6520 Severe sepsis without septic shock: Secondary | ICD-10-CM | POA: Diagnosis present

## 2023-10-30 DIAGNOSIS — E871 Hypo-osmolality and hyponatremia: Secondary | ICD-10-CM | POA: Diagnosis present

## 2023-10-30 DIAGNOSIS — Z803 Family history of malignant neoplasm of breast: Secondary | ICD-10-CM | POA: Diagnosis not present

## 2023-10-30 DIAGNOSIS — N12 Tubulo-interstitial nephritis, not specified as acute or chronic: Secondary | ICD-10-CM | POA: Diagnosis present

## 2023-10-30 DIAGNOSIS — Z809 Family history of malignant neoplasm, unspecified: Secondary | ICD-10-CM

## 2023-10-30 DIAGNOSIS — N200 Calculus of kidney: Secondary | ICD-10-CM | POA: Diagnosis not present

## 2023-10-30 DIAGNOSIS — Z87442 Personal history of urinary calculi: Secondary | ICD-10-CM | POA: Diagnosis not present

## 2023-10-30 DIAGNOSIS — I480 Paroxysmal atrial fibrillation: Secondary | ICD-10-CM | POA: Diagnosis present

## 2023-10-30 DIAGNOSIS — Z833 Family history of diabetes mellitus: Secondary | ICD-10-CM | POA: Diagnosis not present

## 2023-10-30 DIAGNOSIS — E66812 Obesity, class 2: Secondary | ICD-10-CM | POA: Diagnosis present

## 2023-10-30 DIAGNOSIS — E876 Hypokalemia: Secondary | ICD-10-CM | POA: Diagnosis present

## 2023-10-30 DIAGNOSIS — Z8261 Family history of arthritis: Secondary | ICD-10-CM

## 2023-10-30 DIAGNOSIS — Z6835 Body mass index (BMI) 35.0-35.9, adult: Secondary | ICD-10-CM | POA: Diagnosis not present

## 2023-10-30 DIAGNOSIS — N136 Pyonephrosis: Secondary | ICD-10-CM | POA: Diagnosis present

## 2023-10-30 DIAGNOSIS — Z8249 Family history of ischemic heart disease and other diseases of the circulatory system: Secondary | ICD-10-CM

## 2023-10-30 DIAGNOSIS — Z79899 Other long term (current) drug therapy: Secondary | ICD-10-CM | POA: Diagnosis not present

## 2023-10-30 DIAGNOSIS — N202 Calculus of kidney with calculus of ureter: Secondary | ICD-10-CM | POA: Diagnosis present

## 2023-10-30 HISTORY — PX: CYSTOSCOPY WITH STENT PLACEMENT: SHX5790

## 2023-10-30 LAB — RESP PANEL BY RT-PCR (RSV, FLU A&B, COVID)  RVPGX2
Influenza A by PCR: NEGATIVE
Influenza B by PCR: NEGATIVE
Resp Syncytial Virus by PCR: NEGATIVE
SARS Coronavirus 2 by RT PCR: NEGATIVE

## 2023-10-30 LAB — HEPATIC FUNCTION PANEL
ALT: 38 U/L (ref 0–44)
AST: 51 U/L — ABNORMAL HIGH (ref 15–41)
Albumin: 3 g/dL — ABNORMAL LOW (ref 3.5–5.0)
Alkaline Phosphatase: 99 U/L (ref 38–126)
Bilirubin, Direct: 0.2 mg/dL (ref 0.0–0.2)
Indirect Bilirubin: 0.6 mg/dL (ref 0.3–0.9)
Total Bilirubin: 0.8 mg/dL (ref 0.0–1.2)
Total Protein: 7.1 g/dL (ref 6.5–8.1)

## 2023-10-30 LAB — URINALYSIS, ROUTINE W REFLEX MICROSCOPIC
Bilirubin Urine: NEGATIVE
Glucose, UA: NEGATIVE mg/dL
Ketones, ur: NEGATIVE mg/dL
Nitrite: NEGATIVE
Protein, ur: NEGATIVE mg/dL
Specific Gravity, Urine: 1.009 (ref 1.005–1.030)
WBC, UA: >50 WBC/hpf (ref 0–5)
pH: 5 (ref 5.0–8.0)

## 2023-10-30 LAB — BASIC METABOLIC PANEL WITH GFR
Anion gap: 11 (ref 5–15)
BUN: 12 mg/dL (ref 6–20)
CO2: 17 mmol/L — ABNORMAL LOW (ref 22–32)
Calcium: 8.3 mg/dL — ABNORMAL LOW (ref 8.9–10.3)
Chloride: 104 mmol/L (ref 98–111)
Creatinine, Ser: 1.2 mg/dL — ABNORMAL HIGH (ref 0.44–1.00)
GFR, Estimated: 56 mL/min — ABNORMAL LOW (ref >60)
Glucose, Bld: 107 mg/dL — ABNORMAL HIGH (ref 70–99)
Potassium: 3 mmol/L — ABNORMAL LOW (ref 3.5–5.1)
Sodium: 132 mmol/L — ABNORMAL LOW (ref 135–145)

## 2023-10-30 LAB — CBC
HCT: 40.7 % (ref 36.0–46.0)
Hemoglobin: 14.1 g/dL (ref 12.0–15.0)
MCH: 29.5 pg (ref 26.0–34.0)
MCHC: 34.6 g/dL (ref 30.0–36.0)
MCV: 85.1 fL (ref 80.0–100.0)
Platelets: 293 K/uL (ref 150–400)
RBC: 4.78 MIL/uL (ref 3.87–5.11)
RDW: 12.3 % (ref 11.5–15.5)
WBC: 17.6 K/uL — ABNORMAL HIGH (ref 4.0–10.5)
nRBC: 0 % (ref 0.0–0.2)

## 2023-10-30 LAB — LACTIC ACID, PLASMA: Lactic Acid, Venous: 0.9 mmol/L (ref 0.5–1.9)

## 2023-10-30 LAB — LIPASE, BLOOD: Lipase: 28 U/L (ref 11–51)

## 2023-10-30 SURGERY — CYSTOSCOPY, WITH STENT INSERTION
Anesthesia: General | Laterality: Left

## 2023-10-30 MED ORDER — METRONIDAZOLE 500 MG/100ML IV SOLN
500.0000 mg | Freq: Once | INTRAVENOUS | Status: AC
  Administered 2023-10-30: 500 mg via INTRAVENOUS
  Filled 2023-10-30: qty 100

## 2023-10-30 MED ORDER — MIDAZOLAM HCL 2 MG/2ML IJ SOLN
INTRAMUSCULAR | Status: AC
  Filled 2023-10-30: qty 2

## 2023-10-30 MED ORDER — PHENYLEPHRINE 80 MCG/ML (10ML) SYRINGE FOR IV PUSH (FOR BLOOD PRESSURE SUPPORT)
PREFILLED_SYRINGE | INTRAVENOUS | Status: DC | PRN
  Administered 2023-10-30: 120 ug via INTRAVENOUS

## 2023-10-30 MED ORDER — PROPOFOL 10 MG/ML IV BOLUS
INTRAVENOUS | Status: AC
  Filled 2023-10-30: qty 20

## 2023-10-30 MED ORDER — OXYCODONE HCL 5 MG/5ML PO SOLN: 5.0000 mg | Freq: Once | ORAL | Status: DC | PRN

## 2023-10-30 MED ORDER — LIDOCAINE HCL (CARDIAC) PF 100 MG/5ML IV SOSY
PREFILLED_SYRINGE | INTRAVENOUS | Status: DC | PRN
  Administered 2023-10-30: 100 mg via INTRAVENOUS

## 2023-10-30 MED ORDER — IOHEXOL 300 MG/ML  SOLN
100.0000 mL | Freq: Once | INTRAMUSCULAR | Status: AC | PRN
  Administered 2023-10-30: 100 mL via INTRAVENOUS

## 2023-10-30 MED ORDER — PROPOFOL 10 MG/ML IV BOLUS
INTRAVENOUS | Status: DC | PRN
  Administered 2023-10-30: 150 mg via INTRAVENOUS

## 2023-10-30 MED ORDER — POTASSIUM CHLORIDE CRYS ER 20 MEQ PO TBCR
40.0000 meq | EXTENDED_RELEASE_TABLET | Freq: Once | ORAL | Status: AC
  Administered 2023-10-30: 40 meq via ORAL
  Filled 2023-10-30: qty 2

## 2023-10-30 MED ORDER — MORPHINE SULFATE (PF) 4 MG/ML IV SOLN
4.0000 mg | Freq: Once | INTRAVENOUS | Status: AC
  Administered 2023-10-30: 4 mg via INTRAVENOUS
  Filled 2023-10-30: qty 1

## 2023-10-30 MED ORDER — FENTANYL CITRATE (PF) 100 MCG/2ML IJ SOLN: 25.0000 ug | INTRAMUSCULAR | Status: DC | PRN

## 2023-10-30 MED ORDER — SODIUM CHLORIDE 0.9 % IV BOLUS (SEPSIS)
1000.0000 mL | Freq: Once | INTRAVENOUS | Status: AC
  Administered 2023-10-30: 1000 mL via INTRAVENOUS

## 2023-10-30 MED ORDER — LIDOCAINE HCL (PF) 2 % IJ SOLN
INTRAMUSCULAR | Status: AC
Start: 2023-10-30 — End: 2023-10-30
  Filled 2023-10-30: qty 5

## 2023-10-30 MED ORDER — FENTANYL CITRATE (PF) 100 MCG/2ML IJ SOLN
INTRAMUSCULAR | Status: DC | PRN
  Administered 2023-10-30 (×2): 50 ug via INTRAVENOUS

## 2023-10-30 MED ORDER — FENTANYL CITRATE (PF) 100 MCG/2ML IJ SOLN
INTRAMUSCULAR | Status: AC
  Filled 2023-10-30: qty 2

## 2023-10-30 MED ORDER — LACTATED RINGERS IV SOLN
INTRAVENOUS | Status: DC | PRN
Start: 2023-10-30 — End: 2023-10-30

## 2023-10-30 MED ORDER — SODIUM CHLORIDE 0.9 % IR SOLN
Status: DC | PRN
  Administered 2023-10-30: 3000 mL via INTRAVESICAL

## 2023-10-30 MED ORDER — OXYCODONE HCL 5 MG PO TABS: 5.0000 mg | ORAL_TABLET | Freq: Once | ORAL | Status: DC | PRN

## 2023-10-30 MED ORDER — SUCCINYLCHOLINE CHLORIDE 200 MG/10ML IV SOSY
PREFILLED_SYRINGE | INTRAVENOUS | Status: AC
  Filled 2023-10-30: qty 10

## 2023-10-30 MED ORDER — SODIUM CHLORIDE 0.9 % IV SOLN
2.0000 g | Freq: Once | INTRAVENOUS | Status: AC
  Administered 2023-10-30: 2 g via INTRAVENOUS
  Filled 2023-10-30: qty 20

## 2023-10-30 MED ORDER — KETOROLAC TROMETHAMINE 30 MG/ML IJ SOLN
INTRAMUSCULAR | Status: DC | PRN
  Administered 2023-10-30: 30 mg via INTRAVENOUS

## 2023-10-30 MED ORDER — MIDAZOLAM HCL 2 MG/2ML IJ SOLN
INTRAMUSCULAR | Status: DC | PRN
  Administered 2023-10-30: 2 mg via INTRAVENOUS

## 2023-10-30 MED ORDER — IOHEXOL 180 MG/ML  SOLN
INTRAMUSCULAR | Status: DC | PRN
  Administered 2023-10-30: 10 mL

## 2023-10-30 MED ORDER — DEXAMETHASONE SODIUM PHOSPHATE 10 MG/ML IJ SOLN
INTRAMUSCULAR | Status: DC | PRN
  Administered 2023-10-30: 10 mg via INTRAVENOUS

## 2023-10-30 MED ORDER — ONDANSETRON HCL 4 MG/2ML IJ SOLN
INTRAMUSCULAR | Status: DC | PRN
  Administered 2023-10-30: 4 mg via INTRAVENOUS

## 2023-10-30 SURGICAL SUPPLY — 17 items
BAG DRAIN SIEMENS DORNER NS (MISCELLANEOUS) ×1 IMPLANT
CATH URETL OPEN 5X70 (CATHETERS) ×1 IMPLANT
GAUZE 4X4 16PLY ~~LOC~~+RFID DBL (SPONGE) ×2 IMPLANT
GLOVE BIO SURGEON STRL SZ7.5 (GLOVE) ×1 IMPLANT
GOWN STRL REUS W/ TWL LRG LVL3 (GOWN DISPOSABLE) ×2 IMPLANT
GUIDEWIRE STR DUAL SENSOR (WIRE) ×1 IMPLANT
MANIFOLD NEPTUNE II (INSTRUMENTS) ×1 IMPLANT
PACK CYSTO AR (MISCELLANEOUS) ×1 IMPLANT
SET CYSTO W/LG BORE CLAMP LF (SET/KITS/TRAYS/PACK) ×1 IMPLANT
SOL .9 NS 3000ML IRR UROMATIC (IV SOLUTION) ×1 IMPLANT
SOLUTION PREP PVP 2OZ (MISCELLANEOUS) ×1 IMPLANT
STENT URET 6FRX24 CONTOUR (STENTS) ×1 IMPLANT
STENT URET 6FRX26 CONTOUR (STENTS) ×1 IMPLANT
SURGILUBE 2OZ TUBE FLIPTOP (MISCELLANEOUS) ×1 IMPLANT
TRAP FLUID SMOKE EVACUATOR (MISCELLANEOUS) ×1 IMPLANT
WATER STERILE IRR 1000ML POUR (IV SOLUTION) ×1 IMPLANT
WATER STERILE IRR 500ML POUR (IV SOLUTION) ×1 IMPLANT

## 2023-10-30 NOTE — ED Notes (Signed)
 MD Sim notified of pts temp of 100.88F

## 2023-10-30 NOTE — Consult Note (Signed)
 I have been asked to see the patient by Dr. Nilsa Dade, for evaluation and management of left obstructing stone and fever.  History of present illness: 48 year old female who is otherwise fairly healthy the presented to the emergency department with worsening left lower quadrant and left flank pain.  She was having associated nausea, vomiting, and diarrhea.  She was febrile to 102 Fahrenheit.  She was having weakness.  Her symptoms have been present for 5 days, but have progressed since.  In the emergency room the patient was noted to have a leukocytosis and a mild AKI.  A CAT scan demonstrated a stone at the left distal ureter with significant hydronephrosis and poorly perfused kidney consistent with pyelonephritis.  Review of systems: A 12 point comprehensive review of systems was obtained and is negative unless otherwise stated in the history of present illness.  Patient Active Problem List   Diagnosis Date Noted   Left nephrolithiasis 10/30/2023    No current facility-administered medications on file prior to encounter.   Current Outpatient Medications on File Prior to Encounter  Medication Sig Dispense Refill   cyanocobalamin  (,VITAMIN B-12,) 1000 MCG/ML injection Inject 1 mL (1,000 mcg total) into the muscle every 30 (thirty) days. 3 mL 1   cyclobenzaprine  (FLEXERIL ) 10 MG tablet Take 1 tablet (10 mg total) by mouth 3 (three) times daily as needed for muscle spasms. 15 tablet 0   DULoxetine  (CYMBALTA ) 60 MG capsule Take 1 capsule (60 mg total) by mouth daily. 30 capsule 2   ergocalciferol  (VITAMIN D2) 1.25 MG (50000 UT) capsule Take 1 capsule (50,000 Units total) by mouth once a week. 4 capsule 5   etodolac  (LODINE ) 200 MG capsule Take 1 capsule (200 mg total) by mouth every 8 (eight) hours. 30 capsule 0   SUMAtriptan  (IMITREX ) 100 MG tablet Take 1 tablet (100 mg total) by mouth every 2 (two) hours as needed for migraine. May repeat in 2 hours if headache persists or recurs. No more  than 2 tablets per 24 hours. 10 tablet 0   SYRINGE-NEEDLE, DISP, 3 ML (B-D 3CC LUER-LOK SYR 25GX1) 25G X 1 3 ML MISC Use as directed to inject vitamin b-12 into muscle once a month 3 each 1   topiramate  (TOPAMAX ) 25 MG tablet Take 1 tablet (25 mg total) by mouth 2 (two) times daily. 180 tablet 1   traZODone  (DESYREL ) 50 MG tablet Take 0.5-1 tablets (25-50 mg total) by mouth at bedtime as needed for sleep. 30 tablet 3   [DISCONTINUED] promethazine  (PHENERGAN ) 12.5 MG tablet Take 1 tablet (12.5 mg total) by mouth every 6 (six) hours as needed for nausea or vomiting. 12 tablet 0    Past Medical History:  Diagnosis Date   A-fib (HCC)    Anxiety    Depression    Renal disorder    kidney failure at 32    Past Surgical History:  Procedure Laterality Date   ABDOMINAL HYSTERECTOMY     partial   BLADDER SUSPENSION     FOOT SURGERY Right 04/13/2021   TONSILLECTOMY      Social History   Tobacco Use   Smoking status: Never   Smokeless tobacco: Never  Substance Use Topics   Alcohol use: Yes    Comment: occasionally   Drug use: No    Family History  Problem Relation Age of Onset   Heart disease Mother    Arthritis Mother    Heart disease Father    Cancer Father  Arthritis Father    Breast cancer Paternal Aunt        20's   Arthritis Maternal Grandmother    Diabetes Maternal Grandfather    Arthritis Maternal Grandfather     PE: Vitals:   10/30/23 1602 10/30/23 1603  BP: 122/82   Pulse: (!) 128   Resp: 18   Temp: 99.4 F (37.4 C)   Weight:  83.5 kg  Height:  5' (1.524 m)   Patient appears to be in no acute distress  patient is alert and oriented x3 Atraumatic normocephalic head No cervical or supraclavicular lymphadenopathy appreciated No increased work of breathing, no audible wheezes/rhonchi Regular sinus rhythm/rate Abdomen is soft, nondistended, significant left-sided lower quadrant pain and left CVA tenderness Lower extremities are symmetric without  appreciable edema Grossly neurologically intact No identifiable skin lesions  Recent Labs    10/30/23 1605  WBC 17.6*  HGB 14.1  HCT 40.7   Recent Labs    10/30/23 1605  NA 132*  K 3.0*  CL 104  CO2 17*  GLUCOSE 107*  BUN 12  CREATININE 1.20*  CALCIUM 8.3*   No results for input(s): LABPT, INR in the last 72 hours. No results for input(s): LABURIN in the last 72 hours. Results for orders placed or performed during the hospital encounter of 10/30/23  Resp panel by RT-PCR (RSV, Flu A&B, Covid) Anterior Nasal Swab     Status: None   Collection Time: 10/30/23  6:18 PM   Specimen: Anterior Nasal Swab  Result Value Ref Range Status   SARS Coronavirus 2 by RT PCR NEGATIVE NEGATIVE Final    Comment: (NOTE) SARS-CoV-2 target nucleic acids are NOT DETECTED.  The SARS-CoV-2 RNA is generally detectable in upper respiratory specimens during the acute phase of infection. The lowest concentration of SARS-CoV-2 viral copies this assay can detect is 138 copies/mL. A negative result does not preclude SARS-Cov-2 infection and should not be used as the sole basis for treatment or other patient management decisions. A negative result may occur with  improper specimen collection/handling, submission of specimen other than nasopharyngeal swab, presence of viral mutation(s) within the areas targeted by this assay, and inadequate number of viral copies(<138 copies/mL). A negative result must be combined with clinical observations, patient history, and epidemiological information. The expected result is Negative.  Fact Sheet for Patients:  BloggerCourse.com  Fact Sheet for Healthcare Providers:  SeriousBroker.it  This test is no t yet approved or cleared by the [place]  FDA and  has been authorized for detection and/or diagnosis of SARS-CoV-2 by FDA under an Emergency Use Authorization (EUA). This EUA will remain  in effect  (meaning this test can be used) for the duration of the COVID-19 declaration under Section 564(b)(1) of the Act, 21 U.S.C.section 360bbb-3(b)(1), unless the authorization is terminated  or revoked sooner.       Influenza A by PCR NEGATIVE NEGATIVE Final   Influenza B by PCR NEGATIVE NEGATIVE Final    Comment: (NOTE) The Xpert Xpress SARS-CoV-2/FLU/RSV plus assay is intended as an aid in the diagnosis of influenza from Nasopharyngeal swab specimens and should not be used as a sole basis for treatment. Nasal washings and aspirates are unacceptable for Xpert Xpress SARS-CoV-2/FLU/RSV testing.  Fact Sheet for Patients: BloggerCourse.com  Fact Sheet for Healthcare Providers: SeriousBroker.it  This test is not yet approved or cleared by the [place]  FDA and has been authorized for detection and/or diagnosis of SARS-CoV-2 by FDA under an Emergency Use Authorization (EUA). This  EUA will remain in effect (meaning this test can be used) for the duration of the COVID-19 declaration under Section 564(b)(1) of the Act, 21 U.S.C. section 360bbb-3(b)(1), unless the authorization is terminated or revoked.     Resp Syncytial Virus by PCR NEGATIVE NEGATIVE Final    Comment: (NOTE) Fact Sheet for Patients: BloggerCourse.com  Fact Sheet for Healthcare Providers: SeriousBroker.it  This test is not yet approved or cleared by the [place]  FDA and has been authorized for detection and/or diagnosis of SARS-CoV-2 by FDA under an Emergency Use Authorization (EUA). This EUA will remain in effect (meaning this test can be used) for the duration of the COVID-19 declaration under Section 564(b)(1) of the Act, 21 U.S.C. section 360bbb-3(b)(1), unless the authorization is terminated or revoked.  Performed at [facility], [address] [place], [place] [address]     Imaging: I  have independently reviewed the patient's CT scan of the abdomen and pelvis with the findings as noted in the HPI.  Please see that section for more details.  Imp: The patient has a 5 mm left distal ureteral stone with associated fever and concern for urinary tract infection.  She is at high risk for developing sepsis.   Recommendations: Given her significant risk for sepsis I recommended urgent stent placement.  I discussed the surgery with the patient detail.  I recommended that we proceed to the operating room tonight.  We discussed alternatives.  She understands that following this she is going to need ureteroscopy to remove her stone.  She will also need admission and close monitoring from the medicine service.   Morene LELON Salines

## 2023-10-30 NOTE — Transfer of Care (Signed)
 Immediate Anesthesia Transfer of Care Note  Patient: Allison Sherman  Procedure(s) Performed: CYSTOSCOPY, WITH STENT INSERTION (Left)  Patient Location: PACU  Anesthesia Type:General  Level of Consciousness: awake, alert , and oriented  Airway & Oxygen Therapy: Patient Spontanous Breathing and Patient connected to nasal cannula oxygen  Post-op Assessment: Report given to RN, Post -op Vital signs reviewed and stable, and Patient moving all extremities  Post vital signs: Reviewed and stable  Last Vitals:  Vitals Value Taken Time  BP 121/70 10/30/23 21:50  Temp 36.4 C 10/30/23 21:50  Pulse 104 10/30/23 21:56  Resp 19 10/30/23 21:56  SpO2 98 % 10/30/23 21:56  Vitals shown include unfiled device data.  Last Pain:  Vitals:   10/30/23 2150  TempSrc:   PainSc: Asleep         Complications: No notable events documented.

## 2023-10-30 NOTE — Op Note (Signed)
 Preoperative diagnosis:  Left infected ureteral stone   Postoperative diagnosis:  same   Procedure:  Cystoscopy left ureteral stent placement left retrograde pyelography with interpretation   Surgeon: Morene MICAEL Salines, MD  Anesthesia: General  Complications: None  Intraoperative findings:  left retrograde pyelography demonstrated a filling defect within the left ureter consistent with the patient's known calculus without other abnormalities.  EBL: Minimal  Specimens: None  Indication: Allison Sherman is a 48 y.o. patient with infected left ureteral stone. After reviewing the management options for treatment, he elected to proceed with the above surgical procedure(s). We have discussed the potential benefits and risks of the procedure, side effects of the proposed treatment, the likelihood of the patient achieving the goals of the procedure, and any potential problems that might occur during the procedure or recuperation. Informed consent has been obtained.  Description of procedure:  The patient was taken to the operating room and general anesthesia was induced.  The patient was placed in the dorsal lithotomy position, prepped and draped in the usual sterile fashion, and preoperative antibiotics were administered. A preoperative time-out was performed.   Cystourethroscopy was performed.  The patient's urethra was examined and was normal. The bladder was then systematically examined in its entirety. There was no evidence for any bladder tumors, stones, or other mucosal pathology.    Attention then turned to the leftureteral orifice and a ureteral catheter was used to intubate the ureteral orifice.  Omnipaque  contrast was injected through the ureteral catheter and a retrograde pyelogram was performed with findings as dictated above.  A 0.38 sensor guidewire was then advanced up the left ureter into the renal pelvis under fluoroscopic guidance.  The wire was then backloaded through  the cystoscope and a ureteral stent was advance over the wire using Seldinger technique.  The stent was positioned appropriately under fluoroscopic and cystoscopic guidance.  The wire was then removed with an adequate stent curl noted in the renal pelvis as well as in the bladder.  The bladder was then emptied and the procedure ended.  The patient appeared to tolerate the procedure well and without complications.  The patient was able to be awakened and transferred to the recovery unit in satisfactory condition.    Morene MICAEL Salines, M.D.

## 2023-10-30 NOTE — H&P (Signed)
 History and Physical    Patient: Allison Sherman FMW:[id number] DOB: Jan 21, 1976 DOA: 10/30/2023 DOS: the patient was seen and examined on 10/30/2023 PCP: Pcp, No  Patient coming from: Home  Chief Complaint:  Chief Complaint  Patient presents with   Flank Pain   HPI: Allison Sherman is a 48 y.o. female with medical history significant of paroxysmal atrial fibrillation, recurrent nephrolithiasis, anxiety disorder, depression, who presented to the ER with left flank and left lower quadrant pain, fever since Wednesday.  She is also nausea and vomiting as well as diarrhea.  She denied any diarrhea, denied any bright red blood per rectum.  Patient came to the ER where she had a Tmax of 102.  She meets sepsis criteria.  Patient also found to have hyponatremia, hypokalemia, AKI as well as evidence of UTI.  Patient also has an obstructing 4 mm distal left ureteral calculus with left-sided hydronephrosis and hydroureter.  Also another nonobstructing renal calculi.  Patient is being taken to the OR by urology.  She is being admitted with sepsis as a result of infected nephrolithiasis.  Review of Systems: As mentioned in the history of present illness. All other systems reviewed and are negative. Past Medical History:  Diagnosis Date   A-fib (HCC)    Anxiety    Depression    Renal disorder    kidney failure at 42   Past Surgical History:  Procedure Laterality Date   ABDOMINAL HYSTERECTOMY     partial   BLADDER SUSPENSION     FOOT SURGERY Right 04/13/2021   TONSILLECTOMY     Social History:  reports that she has never smoked. She has never used smokeless tobacco. She reports current alcohol use. She reports that she does not use drugs.  No Known Allergies  Family History  Problem Relation Age of Onset   Heart disease Mother    Arthritis Mother    Heart disease Father    Cancer Father    Arthritis Father    Breast cancer Paternal Aunt        20's   Arthritis Maternal Grandmother     Diabetes Maternal Grandfather    Arthritis Maternal Grandfather     Prior to Admission medications   Medication Sig Start Date End Date Taking? Authorizing Provider  cyanocobalamin  (,VITAMIN B-12,) 1000 MCG/ML injection Inject 1 mL (1,000 mcg total) into the muscle every 30 (thirty) days. 07/13/21   Abernathy, Alyssa, NP  cyclobenzaprine  (FLEXERIL ) 10 MG tablet Take 1 tablet (10 mg total) by mouth 3 (three) times daily as needed for muscle spasms. 09/15/23   Gladis Elsie BROCKS, PA-C  DULoxetine  (CYMBALTA ) 60 MG capsule Take 1 capsule (60 mg total) by mouth daily. 08/27/21   Liana Fish, NP  ergocalciferol  (VITAMIN D2) 1.25 MG (50000 UT) capsule Take 1 capsule (50,000 Units total) by mouth once a week. 04/06/21   Abernathy, Alyssa, NP  etodolac  (LODINE ) 200 MG capsule Take 1 capsule (200 mg total) by mouth every 8 (eight) hours. 09/15/23   Gladis Elsie BROCKS, PA-C  SUMAtriptan  (IMITREX ) 100 MG tablet Take 1 tablet (100 mg total) by mouth every 2 (two) hours as needed for migraine. May repeat in 2 hours if headache persists or recurs. No more than 2 tablets per 24 hours. 08/27/21   Abernathy, Fish, NP  SYRINGE-NEEDLE, DISP, 3 ML (B-D 3CC LUER-LOK SYR 25GX1) 25G X 1 3 ML MISC Use as directed to inject vitamin b-12 into muscle once a month 07/13/21   Liana Fish,  NP  topiramate  (TOPAMAX ) 25 MG tablet Take 1 tablet (25 mg total) by mouth 2 (two) times daily. 08/27/21   Liana Fish, NP  traZODone  (DESYREL ) 50 MG tablet Take 0.5-1 tablets (25-50 mg total) by mouth at bedtime as needed for sleep. 09/04/21   Liana Fish, NP  promethazine  (PHENERGAN ) 12.5 MG tablet Take 1 tablet (12.5 mg total) by mouth every 6 (six) hours as needed for nausea or vomiting. 06/14/17 11/09/18  Lang Dover, MD    Physical Exam: Vitals:   10/30/23 1602 10/30/23 1603 10/30/23 2021 10/30/23 2022  BP: 122/82   136/79  Pulse: (!) 128  96 96  Resp: 18  20 18   Temp: 99.4 F (37.4 C)   100.2 F (37.9 C)   TempSrc:    Oral  SpO2:   99% 100%  Weight:  83.5 kg    Height:  5' (1.524 m)     Constitutional: Acutely ill looking, in mild distress Eyes: PERRL, lids and conjunctivae normal ENMT: Mucous membranes are dry posterior pharynx clear of any exudate or lesions.Normal dentition.  Neck: normal, supple, no masses, no thyromegaly Respiratory: clear to auscultation bilaterally, no wheezing, no crackles. Normal respiratory effort. No accessory muscle use.  Cardiovascular: Sinus tachycardia, no murmurs / rubs / gallops. No extremity edema. 2+ pedal pulses. No carotid bruits.  Abdomen: no tenderness, no masses palpated. No hepatosplenomegaly. Bowel sounds positive.  Musculoskeletal: Good range of motion, no joint swelling or tenderness, Skin: no rashes, lesions, ulcers. No induration Neurologic: CN 2-12 grossly intact. Sensation intact, DTR normal. Strength 5/5 in all 4.  Psychiatric: Normal judgment and insight. Alert and oriented x 3. Normal mood  Data Reviewed:  Temperature 102 blood pressure 120/82, pulse 128, white count is 17.6, sodium 132, potassium 3.0 creatinine 1.20 calcium 8.3 CO2 17 glucose 107 acute viral screen is negative, urinalysis shows WBC more than 50 many bacteria do not also cause large leukocytes CT abdomen and pelvis shows obstructing 4 mental left ureteral calculus with mild left-sided hydronephrosis and hydroureter.  Other nonobstructing stones urine culture obtained blood cultures obtained  Assessment and Plan:  #1 sepsis due to infected nephrolithiasis: Patient will be admitted after procedure.  Urine and blood cultures will be obtained.  IV antibiotics started.  Will follow the sepsis protocol including IV fluids.  Will continue supportive care afterwards.  #2. 4 mm nephrolithiasis: Patient will be taken to the OR.  Have cystoscopy with stenting.  #3 left-sided hydroureternephrosis and hydroureter: Continue with urologist.  #4 anxiety with depression: Continue  antidepressants  #5 hyponatremia: Continue to hydrate.  #6 hypokalemia: Will continue to replete potassium  #7 paroxysmal atrial fibrillation: Not on treatment.  Continue close monitoring  #8 AKI: Most likely due to obstructive uropathy.  Will follow renal function    Advance Care Planning:   Code Status: Full Code   Consults: Dr. Morene Salines, urology  Family Communication: No family at bedside  Severity of Illness: The appropriate patient status for this patient is INPATIENT. Inpatient status is judged to be reasonable and necessary in order to provide the required intensity of service to ensure the patient's safety. The patient's presenting symptoms, physical exam findings, and initial radiographic and laboratory data in the context of their chronic comorbidities is felt to place them at high risk for further clinical deterioration. Furthermore, it is not anticipated that the patient will be medically stable for discharge from the hospital within 2 midnights of admission.   * I certify that at the  point of admission it is my clinical judgment that the patient will require inpatient hospital care spanning beyond 2 midnights from the point of admission due to high intensity of service, high risk for further deterioration and high frequency of surveillance required.*  AuthorBETHA SIM KNOLL, MD 10/30/2023 9:13 PM  For on call review [email or web address].

## 2023-10-30 NOTE — Anesthesia Preprocedure Evaluation (Addendum)
 Anesthesia Evaluation  Patient identified by MRN, date of birth, ID band Patient awake    Reviewed: Allergy & Precautions, NPO status , Patient's Chart, lab work & pertinent test results  History of Anesthesia Complications Negative for: history of anesthetic complications  Airway Mallampati: III  TM Distance: <3 FB Neck ROM: full    Dental  (+) Chipped   Pulmonary neg pulmonary ROS, neg COPD   Pulmonary exam normal        Cardiovascular Exercise Tolerance: Good (-) angina (-) Past MI + dysrhythmias Atrial Fibrillation      Neuro/Psych negative neurological ROS     GI/Hepatic negative GI ROS, Neg liver ROS,neg GERD  ,,  Endo/Other  negative endocrine ROS    Renal/GU Renal disease     Musculoskeletal   Abdominal   Peds  Hematology negative hematology ROS (+)   Anesthesia Other Findings Past Medical History: No date: A-fib (HCC) No date: Anxiety No date: Depression No date: Renal disorder     Comment:  kidney failure at 50  Past Surgical History: No date: ABDOMINAL HYSTERECTOMY     Comment:  partial No date: BLADDER SUSPENSION 04/13/2021: FOOT SURGERY; Right No date: TONSILLECTOMY  BMI    Body Mass Index: 35.94 kg/m      Reproductive/Obstetrics negative OB ROS                              Anesthesia Physical Anesthesia Plan  ASA: 3 and emergent  Anesthesia Plan: General LMA   Post-op Pain Management:    Induction: Intravenous  PONV Risk Score and Plan: Ondansetron , Dexamethasone , Midazolam  and Treatment may vary due to age or medical condition  Airway Management Planned: LMA  Additional Equipment:   Intra-op Plan:   Post-operative Plan: Extubation in OR  Informed Consent: I have reviewed the patients History and Physical, chart, labs and discussed the procedure including the risks, benefits and alternatives for the proposed anesthesia with the patient or  authorized representative who has indicated his/her understanding and acceptance.     Dental Advisory Given  Plan Discussed with: Anesthesiologist, CRNA and Surgeon  Anesthesia Plan Comments: (Patient consented for risks of anesthesia including but not limited to:  - adverse reactions to medications - damage to eyes, teeth, lips or other oral mucosa - nerve damage due to positioning  - sore throat or hoarseness - Damage to heart, brain, nerves, lungs, other parts of body or loss of life  Patient voiced understanding and assent.)         Anesthesia Quick Evaluation

## 2023-10-30 NOTE — Sepsis Progress Note (Signed)
 Sepsis protocol is being followed by [facility].

## 2023-10-30 NOTE — ED Triage Notes (Signed)
 Pt comes in via pov with complaints of pain in her left lower abdominal area that radiates around to her back that started Wednesday. Pt with diarrhea, and with fevers at home. Pt complains of pain 8/10 at this time. Pt took tylenol  for fever at about 3 pm. Pt's last episode of diarrhea was this morning, and she states that it happens every time she eats. Pt with no complaints of chest pain or SOB.

## 2023-10-30 NOTE — Anesthesia Procedure Notes (Signed)
 Procedure Name: LMA Insertion Date/Time: 10/30/2023 9:25 PM  Performed by: Landy Francena BIRCH, CRNAPre-anesthesia Checklist: Patient identified, Emergency Drugs available, Suction available and Patient being monitored Patient Re-evaluated:Patient Re-evaluated prior to induction Oxygen Delivery Method: Circle system utilized Preoxygenation: Pre-oxygenation with 100% oxygen Induction Type: IV induction LMA: LMA inserted LMA Size: 4.0 Number of attempts: 1 Airway Equipment and Method: Oral airway Placement Confirmation: positive ETCO2 and breath sounds checked- equal and bilateral Tube secured with: Tape Dental Injury: Teeth and Oropharynx as per pre-operative assessment

## 2023-10-30 NOTE — ED Provider Notes (Signed)
 [facility] Provider Note    Event Date/Time   First MD Initiated Contact with Patient 10/30/23 1727     (approximate)   History   Flank Pain   HPI  Allison Sherman is a 48 y.o. female with PMH of afib, depression, anxiety and renal disorder who presents for evaluation of LLQ abdominal pain that radiates to her back.  Patient states that the pain began on Wednesday and was so severe that she vomited.  Wednesday night she was able to have a little bit of food for dinner but then had diarrhea.  She has had several episodes of diarrhea since.  On Friday evening she developed a fever around 102 which she has been able to manage with Tylenol .  She presents today for evaluation of her ongoing pain.  Patient states that the pain is constant.  She has not had any further episodes of nausea or vomiting.  No urinary symptoms.      Physical Exam   Triage Vital Signs: ED Triage Vitals  Encounter Vitals Group     BP 10/30/23 1602 122/82     Girls Systolic BP Percentile --      Girls Diastolic BP Percentile --      Boys Systolic BP Percentile --      Boys Diastolic BP Percentile --      Pulse Rate 10/30/23 1602 (!) 128     Resp 10/30/23 1602 18     Temp 10/30/23 1602 99.4 F (37.4 C)     Temp src --      SpO2 --      Weight 10/30/23 1603 184 lb (83.5 kg)     Height 10/30/23 1603 5' (1.524 m)     Head Circumference --      Peak Flow --      Pain Score 10/30/23 1603 8     Pain Loc --      Pain Education --      Exclude from Growth Chart --     Most recent vital signs: Vitals:   10/30/23 1602  BP: 122/82  Pulse: (!) 128  Resp: 18  Temp: 99.4 F (37.4 C)   General: Awake, no distress.  CV:  Good peripheral perfusion.  Regular rhythm but tachycardic. Resp:  Normal effort.  CTAB. Abd:  No distention.  Soft, very tender to palpation in the left side of the abdomen. Other:     ED Results / Procedures / Treatments   Labs (all labs ordered are listed,  but only abnormal results are displayed) Labs Reviewed  URINALYSIS, ROUTINE W REFLEX MICROSCOPIC - Abnormal; Notable for the following components:      Result Value   Color, Urine YELLOW (*)    APPearance CLOUDY (*)    Hgb urine dipstick SMALL (*)    Leukocytes,Ua LARGE (*)    Bacteria, UA MANY (*)    All other components within normal limits  BASIC METABOLIC PANEL WITH GFR - Abnormal; Notable for the following components:   Sodium 132 (*)    Potassium 3.0 (*)    CO2 17 (*)    Glucose, Bld 107 (*)    Creatinine, Ser 1.20 (*)    Calcium 8.3 (*)    GFR, Estimated 56 (*)    All other components within normal limits  CBC - Abnormal; Notable for the following components:   WBC 17.6 (*)    All other components within normal limits  HEPATIC FUNCTION PANEL -  Abnormal; Notable for the following components:   Albumin 3.0 (*)    AST 51 (*)    All other components within normal limits  RESP PANEL BY RT-PCR (RSV, FLU A&B, COVID)  RVPGX2  CULTURE, BLOOD (ROUTINE X 2)  CULTURE, BLOOD (ROUTINE X 2)  URINE CULTURE  LACTIC ACID, PLASMA  LIPASE, BLOOD   EKG  ED provider interpretation: Sinus tachycardia  Vent. rate 101 BPM PR interval 126 ms QRS duration 80 ms QT/QTcB 316/409 ms P-R-T axes 22 64 6  RADIOLOGY    PROCEDURES:  Critical Care performed: No  Procedures   MEDICATIONS ORDERED IN ED: Medications  morphine  (PF) 4 MG/ML injection 4 mg (4 mg Intravenous Given 10/30/23 1835)  sodium chloride  0.9 % bolus 1,000 mL (1,000 mLs Intravenous New Bag/Given 10/30/23 1837)  cefTRIAXone  (ROCEPHIN ) 2 g in sodium chloride  0.9 % 100 mL IVPB (2 g Intravenous New Bag/Given 10/30/23 1839)  metroNIDAZOLE  (FLAGYL ) IVPB 500 mg (500 mg Intravenous New Bag/Given 10/30/23 1841)  potassium chloride  SA (KLOR-CON  M) CR tablet 40 mEq (40 mEq Oral Given 10/30/23 1834)  iohexol  (OMNIPAQUE ) 300 MG/ML solution 100 mL (100 mLs Intravenous Contrast Given 10/30/23 1852)     IMPRESSION / MDM / ASSESSMENT AND PLAN  / ED COURSE  I reviewed the triage vital signs and the nursing notes.                             48 year old female presents for evaluation of left-sided abdominal pain.  Patient is tachycardic with low-grade fever on presentation.  She does appear quite uncomfortable on exam.  Vital signs stable otherwise.  Differential diagnosis includes, but is not limited to, viral infection, gastroenteritis, diverticulitis, pyelonephritis, nephrolithiasis, UTI.  Patient's presentation is most consistent with acute complicated illness / injury requiring diagnostic workup.  CBC shows leukocytosis at 17.6. CMP shows mild hyponatremia, hypokalemia, low CO2, elevated glucose, elevated creatinine and decreased GFR. Lactic is not elevated.  Given patient is very TTP on exam, will obtain CT abdomen pelvis with contrast.   Clinical Course as of 10/30/23 1941  Sun Oct 30, 2023  1755 Septic orders initiated given patient's reports of fevers at home, tachycardia, elevated white count and abdominal pain. [LD]  1823 Lipase, blood Within normal limits. [LD]  1824 Urinalysis, Routine w reflex microscopic -Urine, Clean Catch(!) UA shows small amount of hemoglobin, large leukocytes, >50 WBCs and many bacteria. This is consistent with a UTI. [LD]  1915 CT ABDOMEN PELVIS W CONTRAST Left-sided ureterolithiasis, given evidence of infection on urinalysis will consult urology.  CT impression below.  IMPRESSION: 1. Obstructing 4 mm distal left ureteral calculus, with mild left-sided hydronephrosis and hydroureter. 2. Other bilateral nonobstructing renal calculi as above. 3. Small hiatal hernia. 4. Small fat containing umbilical hernia   [LD]  1936 Spoke with the on-call urologist who has to do a procedure at another hospital and then we will plan to come to [place] to take her to surgery for stent placement tonight.  Requested that I admit to medicine.  Spoke with the hospitalist who is agreeable to admit.  Updated  patient on results and plan, she was agreeable.  Advised her to remain NPO.  She voiced understanding. [LD]    Clinical Course User Index [LD] Cleaster Tinnie LABOR, PA-C     FINAL CLINICAL IMPRESSION(S) / ED DIAGNOSES   Final diagnoses:  Left ureteral calculus     Rx / DC Orders  ED Discharge Orders     None        Note:  This document was prepared using Dragon voice recognition software and may include unintentional dictation errors.   Cleaster Tinnie LABOR, PA-C 10/30/23 1942    Levander Slate, MD 11/03/23 [phone]

## 2023-10-30 NOTE — ED Provider Notes (Signed)
 APP supervisory note  48 year old female presenting with left lower quadrant pain beginning on Wednesday.  Associated nausea and vomiting as well as diarrhea.  Tmax at home of 102.  No history of similar symptoms.  Tenderness to palpation most notably over the left lower quadrant on exam.  Labs with leukocytosis WBC 17.6.  Tachycardic on presentation with heart rate of 128.  Afebrile presentation.  Sepsis orders initiated with empiric Rocephin  and Flagyl .  Ordered for IV fluids.  BMP with mild AKI and hypokalemia.  LFTs reassuring.  Normal lactate.  Urinalysis concerning for infection.  Urine culture sent.  CT ordered to further evaluate, formal radiology read pending.  Disposition pending CT, but will likely require admission if evidence of pyelonephritis.  If demonstrates obstructing stone plan for question for urology for possible stent placement given concerning urinalysis.   Levander Slate, MD 10/30/23 [phone]

## 2023-10-30 NOTE — Anesthesia Postprocedure Evaluation (Signed)
 Anesthesia Post Note  Patient: Allison Sherman  Procedure(s) Performed: CYSTOSCOPY, WITH STENT INSERTION (Left)  Patient location during evaluation: PACU Anesthesia Type: General Level of consciousness: awake and alert Pain management: pain level controlled Vital Signs Assessment: post-procedure vital signs reviewed and stable Respiratory status: spontaneous breathing, nonlabored ventilation and respiratory function stable Cardiovascular status: blood pressure returned to baseline and stable Postop Assessment: no apparent nausea or vomiting Anesthetic complications: no   No notable events documented.   Last Vitals:  Vitals:   10/30/23 2150 10/30/23 2200  BP: 121/70 118/78  Pulse: (!) 111 (!) 112  Resp: (!) 22 17  Temp: 36.4 C   SpO2: 95% 97%    Last Pain:  Vitals:   10/30/23 2200  TempSrc:   PainSc: 0-No pain                 Fairy POUR Iram Lundberg

## 2023-10-30 NOTE — Progress Notes (Signed)
 CODE SEPSIS - PHARMACY COMMUNICATION  **Broad Spectrum Antibiotics should be administered within 1 hour of Sepsis diagnosis**  Time Code Sepsis Called/Page Received: 1754  Antibiotics Ordered: Ceftriaxone , Flagyl   Time of 1st antibiotic administration: 1839  Alan Hoe, PharmD 10/30/2023 5:56 PM

## 2023-10-31 ENCOUNTER — Encounter: Payer: Self-pay | Admitting: Urology

## 2023-10-31 ENCOUNTER — Other Ambulatory Visit: Payer: Self-pay | Admitting: Physician Assistant

## 2023-10-31 DIAGNOSIS — A419 Sepsis, unspecified organism: Secondary | ICD-10-CM

## 2023-10-31 DIAGNOSIS — N201 Calculus of ureter: Secondary | ICD-10-CM

## 2023-10-31 DIAGNOSIS — N200 Calculus of kidney: Secondary | ICD-10-CM | POA: Diagnosis not present

## 2023-10-31 LAB — COMPREHENSIVE METABOLIC PANEL WITH GFR
ALT: 29 U/L (ref 0–44)
AST: 28 U/L (ref 15–41)
Albumin: 2.5 g/dL — ABNORMAL LOW (ref 3.5–5.0)
Alkaline Phosphatase: 83 U/L (ref 38–126)
Anion gap: 8 (ref 5–15)
BUN: 15 mg/dL (ref 6–20)
CO2: 18 mmol/L — ABNORMAL LOW (ref 22–32)
Calcium: 8.4 mg/dL — ABNORMAL LOW (ref 8.9–10.3)
Chloride: 110 mmol/L (ref 98–111)
Creatinine, Ser: 1.14 mg/dL — ABNORMAL HIGH (ref 0.44–1.00)
GFR, Estimated: 59 mL/min — ABNORMAL LOW (ref >60)
Glucose, Bld: 163 mg/dL — ABNORMAL HIGH (ref 70–99)
Potassium: 3.8 mmol/L (ref 3.5–5.1)
Sodium: 136 mmol/L (ref 135–145)
Total Bilirubin: 0.5 mg/dL (ref 0.0–1.2)
Total Protein: 6.4 g/dL — ABNORMAL LOW (ref 6.5–8.1)

## 2023-10-31 LAB — HIV ANTIBODY (ROUTINE TESTING W REFLEX): HIV Screen 4th Generation wRfx: NONREACTIVE

## 2023-10-31 LAB — CBC
HCT: 37.7 % (ref 36.0–46.0)
Hemoglobin: 13 g/dL (ref 12.0–15.0)
MCH: 29.8 pg (ref 26.0–34.0)
MCHC: 34.5 g/dL (ref 30.0–36.0)
MCV: 86.5 fL (ref 80.0–100.0)
Platelets: 258 K/uL (ref 150–400)
RBC: 4.36 MIL/uL (ref 3.87–5.11)
RDW: 12.3 % (ref 11.5–15.5)
WBC: 8.5 K/uL (ref 4.0–10.5)
nRBC: 0 % (ref 0.0–0.2)

## 2023-10-31 MED ORDER — ONDANSETRON HCL 4 MG PO TABS: 4.0000 mg | ORAL_TABLET | Freq: Four times a day (QID) | ORAL | Status: DC | PRN

## 2023-10-31 MED ORDER — OXYBUTYNIN CHLORIDE 5 MG PO TABS
5.0000 mg | ORAL_TABLET | Freq: Three times a day (TID) | ORAL | Status: DC | PRN
  Administered 2023-11-01: 5 mg via ORAL
  Filled 2023-10-31 (×2): qty 1

## 2023-10-31 MED ORDER — DULOXETINE HCL 30 MG PO CPEP
60.0000 mg | ORAL_CAPSULE | Freq: Every day | ORAL | Status: DC
  Administered 2023-10-31 – 2023-11-01 (×2): 60 mg via ORAL
  Filled 2023-10-31 (×2): qty 2

## 2023-10-31 MED ORDER — SODIUM CHLORIDE 0.9 % IV SOLN: 1.0000 g | INTRAVENOUS | Status: DC

## 2023-10-31 MED ORDER — MORPHINE SULFATE (PF) 2 MG/ML IV SOLN
2.0000 mg | INTRAVENOUS | Status: DC | PRN
  Administered 2023-10-31: 2 mg via INTRAVENOUS
  Filled 2023-10-31: qty 1

## 2023-10-31 MED ORDER — TOPIRAMATE 25 MG PO TABS
25.0000 mg | ORAL_TABLET | Freq: Two times a day (BID) | ORAL | Status: DC
  Administered 2023-10-31 – 2023-11-01 (×3): 25 mg via ORAL
  Filled 2023-10-31 (×5): qty 1

## 2023-10-31 MED ORDER — TAMSULOSIN HCL 0.4 MG PO CAPS
0.4000 mg | ORAL_CAPSULE | Freq: Every day | ORAL | Status: DC
  Administered 2023-10-31 – 2023-11-01 (×2): 0.4 mg via ORAL
  Filled 2023-10-31 (×2): qty 1

## 2023-10-31 MED ORDER — ORAL CARE MOUTH RINSE: 15.0000 mL | OROMUCOSAL | Status: DC | PRN

## 2023-10-31 MED ORDER — POTASSIUM CHLORIDE 2 MEQ/ML IV SOLN
INTRAVENOUS | Status: DC
  Filled 2023-10-31 (×3): qty 1000

## 2023-10-31 MED ORDER — OXYCODONE HCL 5 MG PO TABS
10.0000 mg | ORAL_TABLET | ORAL | Status: DC | PRN
  Administered 2023-10-31 – 2023-11-01 (×3): 10 mg via ORAL
  Filled 2023-10-31 (×3): qty 2

## 2023-10-31 MED ORDER — ONDANSETRON HCL 4 MG/2ML IJ SOLN: 4.0000 mg | Freq: Four times a day (QID) | INTRAMUSCULAR | Status: DC | PRN

## 2023-10-31 MED ORDER — SODIUM CHLORIDE 0.9 % IV SOLN
2.0000 g | INTRAVENOUS | Status: DC
  Administered 2023-10-31: 2 g via INTRAVENOUS
  Filled 2023-10-31 (×2): qty 20

## 2023-10-31 MED ORDER — TRAZODONE HCL 50 MG PO TABS: 25.0000 mg | ORAL_TABLET | Freq: Every evening | ORAL | Status: DC | PRN

## 2023-10-31 MED ORDER — OXYCODONE HCL 5 MG PO TABS
5.0000 mg | ORAL_TABLET | ORAL | Status: DC | PRN
  Administered 2023-10-31: 5 mg via ORAL
  Filled 2023-10-31: qty 1

## 2023-10-31 MED ORDER — ENOXAPARIN SODIUM 40 MG/0.4ML IJ SOSY
40.0000 mg | PREFILLED_SYRINGE | INTRAMUSCULAR | Status: DC
  Administered 2023-10-31: 40 mg via SUBCUTANEOUS
  Filled 2023-10-31: qty 0.4

## 2023-10-31 NOTE — Progress Notes (Signed)
 Urology Inpatient Progress Note  Subjective: No acute events overnight. She is afebrile, VSS. WBC count down, 8.5. Creatinine down, 1.14. Urine culture pending, blood cultures pending with no growth at <12 hours; on antibiotics as below. She is spontaneously voiding. Today she reports some LLQ soreness. Notably, she mentions a family history of kidney stones in her father and maternal aunt.  She has been on Topamax  for about a year.  Anti-infectives: Anti-infectives (From admission, onward)    Start     Dose/Rate Route Frequency Ordered Stop   10/31/23 1800  cefTRIAXone  (ROCEPHIN ) 1 g in sodium chloride  0.9 % 100 mL IVPB  Status:  Discontinued        1 g 200 mL/hr over 30 Minutes Intravenous Every 24 hours 10/31/23 0455 10/31/23 0841   10/31/23 1800  cefTRIAXone  (ROCEPHIN ) 2 g in sodium chloride  0.9 % 100 mL IVPB        2 g 200 mL/hr over 30 Minutes Intravenous Every 24 hours 10/31/23 0841     10/30/23 1800  cefTRIAXone  (ROCEPHIN ) 2 g in sodium chloride  0.9 % 100 mL IVPB        2 g 200 mL/hr over 30 Minutes Intravenous Once 10/30/23 1754 10/30/23 2018   10/30/23 1800  metroNIDAZOLE  (FLAGYL ) IVPB 500 mg        500 mg 100 mL/hr over 60 Minutes Intravenous  Once 10/30/23 1754 10/30/23 2018       Current Facility-Administered Medications  Medication Dose Route Frequency Provider Last Rate Last Admin   cefTRIAXone  (ROCEPHIN ) 2 g in sodium chloride  0.9 % 100 mL IVPB  2 g Intravenous Q24H Patel, Kishan S, RPH       enoxaparin  (LOVENOX ) injection 40 mg  40 mg Subcutaneous Q24H Sim, Mohammad L, MD       lactated ringers  1,000 mL with potassium chloride  20 mEq infusion   Intravenous Continuous Sim Emery CROME, MD 125 mL/hr at 10/31/23 0606 New Bag at 10/31/23 0606   morphine  (PF) 2 MG/ML injection 2 mg  2 mg Intravenous Q2H PRN Sim Emery CROME, MD   2 mg at 10/31/23 9474   ondansetron  (ZOFRAN ) tablet 4 mg  4 mg Oral Q6H PRN Sim Emery CROME, MD       Or   ondansetron  (ZOFRAN )  injection 4 mg  4 mg Intravenous Q6H PRN Sim Emery CROME, MD       Oral care mouth rinse  15 mL Mouth Rinse PRN Mansy, Madison LABOR, MD        Objective: Vital signs in last 24 hours: Temp:  [97.6 F (36.4 C)-100.2 F (37.9 C)] 98 F (36.7 C) (09/08 0821) Pulse Rate:  [60-128] 60 (09/08 0821) Resp:  [17-24] 18 (09/08 0821) BP: (84-136)/(64-82) 116/71 (09/08 0821) SpO2:  [95 %-100 %] 100 % (09/08 0821) Weight:  [83.5 kg] 83.5 kg (09/07 1603)  Intake/Output from previous day: 09/07 0701 - 09/08 0700 In: 1120 [P.O.:720; I.V.:400] Out: -  Intake/Output this shift: No intake/output data recorded.  Physical Exam Vitals and nursing note reviewed.  Constitutional:      General: She is not in acute distress.    Appearance: She is not ill-appearing, toxic-appearing or diaphoretic.  HENT:     Head: Normocephalic and atraumatic.  Pulmonary:     Effort: Pulmonary effort is normal. No respiratory distress.  Skin:    General: Skin is warm and dry.  Neurological:     Mental Status: She is alert and oriented to person, place, and time.  Psychiatric:        Mood and Affect: Mood normal.        Behavior: Behavior normal.    Lab Results:  Recent Labs    10/30/23 1605 10/31/23 0509  WBC 17.6* 8.5  HGB 14.1 13.0  HCT 40.7 37.7  PLT 293 258   BMET Recent Labs    10/30/23 1605 10/31/23 0509  NA 132* 136  K 3.0* 3.8  CL 104 110  CO2 17* 18*  GLUCOSE 107* 163*  BUN 12 15  CREATININE 1.20* 1.14*  CALCIUM 8.3* 8.4*   Studies/Results: DG OR UROLOGY CYSTO IMAGE ([facility] ONLY) Result Date: 10/30/2023 There is no interpretation for this exam.  This order is for images obtained during a surgical procedure.  Please See Surgeries Tab for more information regarding the procedure.   CT ABDOMEN PELVIS W CONTRAST Result Date: 10/30/2023 CLINICAL DATA:  Left lower quadrant pain, diarrhea EXAM: CT ABDOMEN AND PELVIS WITH CONTRAST TECHNIQUE: Multidetector CT imaging of the abdomen and pelvis was  performed using the standard protocol following bolus administration of intravenous contrast. RADIATION DOSE REDUCTION: This exam was performed according to the departmental dose-optimization program which includes automated exposure control, adjustment of the mA and/or kV according to patient size and/or use of iterative reconstruction technique. CONTRAST:  OMNIPAQUE  IOHEXOL  300 MG/ML  SOLN COMPARISON:  09/14/2023 FINDINGS: Lower chest: No acute pleural or parenchymal lung disease. Hepatobiliary: No focal liver abnormality is seen. No gallstones, gallbladder wall thickening, or biliary dilatation. Pancreas: Unremarkable. No pancreatic ductal dilatation or surrounding inflammatory changes. Spleen: Normal in size without focal abnormality. Adrenals/Urinary Tract: There is an obstructing 4 mm distal left ureteral calculus, reference image 81/2, with mild left-sided hydronephrosis and hydroureter. There is delayed excretion of contrast within the left kidney. There are other bilateral nonobstructing renal calculi identified, measuring up to 4 mm on the left and 5 mm on the right. No right-sided obstruction. The adrenals and bladder are unremarkable. Stomach/Bowel: No bowel obstruction or ileus. Normal appendix right lower quadrant. No bowel wall thickening or inflammatory change. Small hiatal hernia. Vascular/Lymphatic: No significant vascular findings are present. No enlarged abdominal or pelvic lymph nodes. Reproductive: Status post hysterectomy. No adnexal masses. Other: No free fluid or free intraperitoneal gas. Small fat containing umbilical hernia. No bowel herniation. Musculoskeletal: No acute or destructive bony abnormalities. 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 Reconstructed images demonstrate no additional findings. IMPRESSION: 1. Obstructing 4 mm distal left ureteral calculus, with mild left-sided hydronephrosis and hydroureter. 2. Other bilateral nonobstructing renal  calculi as above. 3. Small hiatal hernia. 4. Small fat containing umbilical hernia. Electronically Signed   By: Ozell Daring M.D.   On: 10/30/2023 19:10   Assessment & Plan: 48 y.o. female with PMH paroxysmal A-fib, nephrolithiasis, and migraines on Topamax  now s/p left ureteral stent placement with Dr. Cam for management of sepsis due to pyelonephritis with an obstructing 4 mm distal left ureteral stone.  She is clinically improving on empiric antibiotics.  She is tolerating her stent well with only mild LLQ soreness.  We discussed that she will require 10-14 days of culture-appropriate antibiotics to treat her infection, followed by outpatient left ureteroscopy with laser lithotripsy and stent exchange in 2-3 weeks to treat her stone. She expressed understanding.  Recommendations: -Continue empiric antibiotics and follow cultures for a total of 10-14 days of culture-appropriate therapy. -Start Flomax  0.4mg  daily (ordered) and consider oxybutynin  5mg  every 8 hours as needed for stent discomfort. -Outpatient left URS/LL/stent exchange with  Dr. Francisca in 2-3 weeks; our scheduler will contact her after discharge to arrange - May need to consider Topamax  alternatives given multiple stones seen on CT  Lucie Hones, PA-C 10/31/2023

## 2023-10-31 NOTE — Plan of Care (Signed)

## 2023-10-31 NOTE — Hospital Course (Addendum)
 Hospital course / significant events:   HPI: Allison Sherman is a 48 y.o. female with medical history significant of paroxysmal atrial fibrillation, recurrent nephrolithiasis, anxiety disorder, depression, who presented to the ER 09/07 with left flank and left lower quadrant pain, fever since Wednesday 09/03.   09/07: to ED. (+)sepsis w/ pyelonephritis and obstructing 4 mm distal left ureteral calculus with left-sided hydronephrosis and hydroureter. To OR for Cystoscopy, left ureteral stent placement, left retrograde pyelography  09/08: doing well, awaiting culture results  09/09: (+)enterobacter sensitive to cipro , ok for discharge   Consultants:  Urology   Procedures/Surgeries: 10/30/23:  Cystoscopy, left ureteral stent placement, left retrograde pyelography -Dr B. Herrick       ASSESSMENT & PLAN:   Sepsis d/t UTI/pyelonephritis Complicate by obstructing ureterolithiasis and associated non-obstructing nephrolithiasis Associated AKI  10/31/23 is POD2 s/p cystoscopy, left ureteral stent placement Cipro  x 14d  Pain control Flomax  0.4 mg daily Oxybutynin  5 mg q8h prn stent discomfort Urology following --> Outpatient left URS/LL/stent exchange with Dr. Francisca in 2-3 weeks; urology scheduler will contact her after discharge to arrange May need to consider Topamax  alternatives given multiple stones seen on CT  Hypokalemia - resolved Replace as needed Monitor BMP  Hyponatremia - resolved  Monitor BMP   Leukocytosis d/t sepsis/SIRS - resolved Monitor CBC  Anxiety with depression Continue home meds    Paroxysmal atrial fibrillation Not on treatment.   Follow outpatient       Class 2 obesity based on BMI: Body mass index is 35.94 kg/m.SABRA Significantly low or high BMI is associated with higher medical risk.  Underweight - under 18  overweight - 25 to 29 obese - 30 or more Class 1 obesity: BMI of 30.0 to 34 Class 2 obesity: BMI of 35.0 to 39 Class 3 obesity: BMI of 40.0  to 49 Super Morbid Obesity: BMI 50-59 Super-super Morbid Obesity: BMI 60+ Healthy nutrition and physical activity advised as adjunct to other disease management and risk reduction treatments    DVT prophylaxis: lovenox  IV fluids: no continuous IV fluids  Nutrition: regular diet Central lines / other devices: ureteral stent   Code Status: FULL CODE ACP documentation reviewed:  none on file in [facility]  TOC needs: none Medical barriers to dispo: urine culture. Expected medical readiness for discharge tomorrow.

## 2023-10-31 NOTE — Progress Notes (Signed)
 PROGRESS NOTE    Allison Sherman   FMW:[id number] DOB: June 14, 1975  DOA: 10/30/2023 Date of Service: 10/31/23 which is hospital day 1  PCP: Pcp, No    Hospital course / significant events:   HPI: Allison Sherman is a 48 y.o. female with medical history significant of paroxysmal atrial fibrillation, recurrent nephrolithiasis, anxiety disorder, depression, who presented to the ER 09/07 with left flank and left lower quadrant pain, fever since Wednesday 09/03.   09/07: to ED. (+)sepsis w/ pyelonephritis and obstructing 4 mm distal left ureteral calculus with left-sided hydronephrosis and hydroureter. To OR for Cystoscopy, left ureteral stent placement, left retrograde pyelography  09/08: doing well, awaiting culture results   Consultants:  Urology   Procedures/Surgeries: 10/30/23:  Cystoscopy, left ureteral stent placement, left retrograde pyelography -Dr B. Herrick       ASSESSMENT & PLAN:   Sepsis d/t UTI/pyelonephritis Complicate by obstructing ureterolithiasis and associated non-obstructing nephrolithiasis Associated AKI  10/31/23 is POD1 s/p cystoscopy, left ureteral stent placement Follow urine culture Follow blood cutures Abx: rocephin  IV pending culture results. Will need 10-14d culture-appropriate tx  Pain control Flomax  0.4 mg daily Oxybutynin  5 mg q8h prn stent discomfort Urology following --> Outpatient left URS/LL/stent exchange with Dr. Francisca in 2-3 weeks; urology scheduler will contact her after discharge to arrange May need to consider Topamax  alternatives given multiple stones seen on CT Monitor BMP  Hypokalemia - resolved Replace as needed Monitor BMP  Hyponatremia - resolved  Monitor BMP   Leukocytosis d/t sepsis/SIRS - resolved Monitor CBC  Anxiety with depression Continue home meds    Paroxysmal atrial fibrillation Not on treatment.   Continue monitoring / telemetry for now      Class 2 obesity based on BMI: Body mass index is 35.94  kg/m.SABRA Significantly low or high BMI is associated with higher medical risk.  Underweight - under 18  overweight - 25 to 29 obese - 30 or more Class 1 obesity: BMI of 30.0 to 34 Class 2 obesity: BMI of 35.0 to 39 Class 3 obesity: BMI of 40.0 to 49 Super Morbid Obesity: BMI 50-59 Super-super Morbid Obesity: BMI 60+ Healthy nutrition and physical activity advised as adjunct to other disease management and risk reduction treatments    DVT prophylaxis: lovenox  IV fluids: no continuous IV fluids  Nutrition: regular diet Central lines / other devices: ureteral stent   Code Status: FULL CODE ACP documentation reviewed:  none on file in [facility]  TOC needs: none Medical barriers to dispo: urine culture. Expected medical readiness for discharge tomorrow.              Subjective / Brief ROS:  Patient reports feeling much better today Denies CP/SOB.  Pain controlled.  Denies new weakness.  Tolerating diet.  Reports no concerns w/ urination/defecation.   Family Communication: none at this time     Objective Findings:  Vitals:   10/31/23 0444 10/31/23 0446 10/31/23 0821 10/31/23 1132  BP: (!) 84/64 95/71 116/71 121/72  Pulse: 62 60 60 77  Resp: 18  18 18   Temp: 97.7 F (36.5 C)  98 F (36.7 C) 97.8 F (36.6 C)  TempSrc:      SpO2: 98%  100% 99%  Weight:      Height:        Intake/Output Summary (Last 24 hours) at 10/31/2023 1532 Last data filed at 10/31/2023 0900 Gross per 24 hour  Intake 1360 ml  Output --  Net 1360 ml   [employer]  10/30/23 1603  Weight: 83.5 kg    Examination:  Physical Exam Constitutional:      General: She is not in acute distress. Cardiovascular:     Rate and Rhythm: Normal rate and regular rhythm.  Pulmonary:     Effort: Pulmonary effort is normal.     Breath sounds: Normal breath sounds.  Abdominal:     General: Abdomen is flat.     Palpations: Abdomen is soft.  Neurological:     Mental Status: She is alert.   Psychiatric:        Mood and Affect: Mood normal.        Behavior: Behavior normal.          Scheduled Medications:   DULoxetine   60 mg Oral Daily   enoxaparin  (LOVENOX ) injection  40 mg Subcutaneous Q24H   tamsulosin   0.4 mg Oral Daily   topiramate   25 mg Oral BID    Continuous Infusions:  cefTRIAXone  (ROCEPHIN )  IV     lactated ringers  1,000 mL with potassium chloride  20 mEq infusion 125 mL/hr at 10/31/23 0606    PRN Medications:  morphine  injection, ondansetron  **OR** ondansetron  (ZOFRAN ) IV, mouth rinse, oxybutynin , oxyCODONE , oxyCODONE , traZODone   Antimicrobials from admission:  Anti-infectives (From admission, onward)    Start     Dose/Rate Route Frequency Ordered Stop   10/31/23 1800  cefTRIAXone  (ROCEPHIN ) 1 g in sodium chloride  0.9 % 100 mL IVPB  Status:  Discontinued        1 g 200 mL/hr over 30 Minutes Intravenous Every 24 hours 10/31/23 0455 10/31/23 0841   10/31/23 1800  cefTRIAXone  (ROCEPHIN ) 2 g in sodium chloride  0.9 % 100 mL IVPB        2 g 200 mL/hr over 30 Minutes Intravenous Every 24 hours 10/31/23 0841     10/30/23 1800  cefTRIAXone  (ROCEPHIN ) 2 g in sodium chloride  0.9 % 100 mL IVPB        2 g 200 mL/hr over 30 Minutes Intravenous Once 10/30/23 1754 10/30/23 2018   10/30/23 1800  metroNIDAZOLE  (FLAGYL ) IVPB 500 mg        500 mg 100 mL/hr over 60 Minutes Intravenous  Once 10/30/23 1754 10/30/23 2018           Data Reviewed:  I have personally reviewed the following...  CBC: Recent Labs  Lab 10/30/23 1605 10/31/23 0509  WBC 17.6* 8.5  HGB 14.1 13.0  HCT 40.7 37.7  MCV 85.1 86.5  PLT 293 258   Basic Metabolic Panel: Recent Labs  Lab 10/30/23 1605 10/31/23 0509  NA 132* 136  K 3.0* 3.8  CL 104 110  CO2 17* 18*  GLUCOSE 107* 163*  BUN 12 15  CREATININE 1.20* 1.14*  CALCIUM 8.3* 8.4*   GFR: Estimated Creatinine Clearance: 57.8 mL/min (A) (by C-G formula based on SCr of 1.14 mg/dL (H)). Liver Function Tests: Recent Labs   Lab 10/30/23 1605 10/31/23 0509  AST 51* 28  ALT 38 29  ALKPHOS 99 83  BILITOT 0.8 0.5  PROT 7.1 6.4*  ALBUMIN 3.0* 2.5*   Recent Labs  Lab 10/30/23 1605  LIPASE 28   No results for input(s): AMMONIA in the last 168 hours. Coagulation Profile: No results for input(s): INR, PROTIME in the last 168 hours. Cardiac Enzymes: No results for input(s): CKTOTAL, CKMB, CKMBINDEX, TROPONINI in the last 168 hours. BNP (last 3 results) No results for input(s): PROBNP in the last 8760 hours. HbA1C: No results for input(s): HGBA1C in the  last 72 hours. CBG: No results for input(s): GLUCAP in the last 168 hours. Lipid Profile: No results for input(s): CHOL, HDL, LDLCALC, TRIG, CHOLHDL, LDLDIRECT in the last 72 hours. Thyroid Function Tests: No results for input(s): TSH, T4TOTAL, FREET4, T3FREE, THYROIDAB in the last 72 hours. Anemia Panel: No results for input(s): VITAMINB12, FOLATE, FERRITIN, TIBC, IRON, RETICCTPCT in the last 72 hours. Most Recent Urinalysis On File:     Component Value Date/Time   COLORURINE YELLOW (A) 10/30/2023 1809   APPEARANCEUR CLOUDY (A) 10/30/2023 1809   APPEARANCEUR Turbid (A) 05/01/2021 1202   LABSPEC 1.009 10/30/2023 1809   LABSPEC 1.011 08/28/2011 1305   PHURINE 5.0 10/30/2023 1809   GLUCOSEU NEGATIVE 10/30/2023 1809   GLUCOSEU Negative 08/28/2011 1305   HGBUR SMALL (A) 10/30/2023 1809   BILIRUBINUR NEGATIVE 10/30/2023 1809   BILIRUBINUR Negative 05/01/2021 1202   BILIRUBINUR Negative 08/28/2011 1305   KETONESUR NEGATIVE 10/30/2023 1809   PROTEINUR NEGATIVE 10/30/2023 1809   NITRITE NEGATIVE 10/30/2023 1809   LEUKOCYTESUR LARGE (A) 10/30/2023 1809   LEUKOCYTESUR Negative 08/28/2011 1305   Sepsis Labs: @LABRCNTIP (procalcitonin:4,lacticidven:4) Microbiology: Recent Results (from the past 240 hours)  Resp panel by RT-PCR (RSV, Flu A&B, Covid) Anterior Nasal Swab     Status: None   Collection  Time: 10/30/23  6:18 PM   Specimen: Anterior Nasal Swab  Result Value Ref Range Status   SARS Coronavirus 2 by RT PCR NEGATIVE NEGATIVE Final    Comment: (NOTE) SARS-CoV-2 target nucleic acids are NOT DETECTED.  The SARS-CoV-2 RNA is generally detectable in upper respiratory specimens during the acute phase of infection. The lowest concentration of SARS-CoV-2 viral copies this assay can detect is 138 copies/mL. A negative result does not preclude SARS-Cov-2 infection and should not be used as the sole basis for treatment or other patient management decisions. A negative result may occur with  improper specimen collection/handling, submission of specimen other than nasopharyngeal swab, presence of viral mutation(s) within the areas targeted by this assay, and inadequate number of viral copies(<138 copies/mL). A negative result must be combined with clinical observations, patient history, and epidemiological information. The expected result is Negative.  Fact Sheet for Patients:  BloggerCourse.com  Fact Sheet for Healthcare Providers:  SeriousBroker.it  This test is no t yet approved or cleared by the [place]  FDA and  has been authorized for detection and/or diagnosis of SARS-CoV-2 by FDA under an Emergency Use Authorization (EUA). This EUA will remain  in effect (meaning this test can be used) for the duration of the COVID-19 declaration under Section 564(b)(1) of the Act, 21 U.S.C.section 360bbb-3(b)(1), unless the authorization is terminated  or revoked sooner.       Influenza A by PCR NEGATIVE NEGATIVE Final   Influenza B by PCR NEGATIVE NEGATIVE Final    Comment: (NOTE) The Xpert Xpress SARS-CoV-2/FLU/RSV plus assay is intended as an aid in the diagnosis of influenza from Nasopharyngeal swab specimens and should not be used as a sole basis for treatment. Nasal washings and aspirates are unacceptable for Xpert Xpress  SARS-CoV-2/FLU/RSV testing.  Fact Sheet for Patients: BloggerCourse.com  Fact Sheet for Healthcare Providers: SeriousBroker.it  This test is not yet approved or cleared by the [place]  FDA and has been authorized for detection and/or diagnosis of SARS-CoV-2 by FDA under an Emergency Use Authorization (EUA). This EUA will remain in effect (meaning this test can be used) for the duration of the COVID-19 declaration under Section 564(b)(1) of the Act, 21 U.S.C. section 360bbb-3(b)(1), unless  the authorization is terminated or revoked.     Resp Syncytial Virus by PCR NEGATIVE NEGATIVE Final    Comment: (NOTE) Fact Sheet for Patients: BloggerCourse.com  Fact Sheet for Healthcare Providers: SeriousBroker.it  This test is not yet approved or cleared by the [place]  FDA and has been authorized for detection and/or diagnosis of SARS-CoV-2 by FDA under an Emergency Use Authorization (EUA). This EUA will remain in effect (meaning this test can be used) for the duration of the COVID-19 declaration under Section 564(b)(1) of the Act, 21 U.S.C. section 360bbb-3(b)(1), unless the authorization is terminated or revoked.  Performed at [facility], [address] [place], [place] [address]   Blood Culture (routine x 2)     Status: None (Preliminary result)   Collection Time: 10/30/23  6:18 PM   Specimen: BLOOD  Result Value Ref Range Status   Specimen Description BLOOD RIGHT ANTECUBITAL  Final   Special Requests   Final    BOTTLES DRAWN AEROBIC AND ANAEROBIC Blood Culture adequate volume   Culture   Final    NO GROWTH < 12 HOURS Performed at [facility], [address] [place], [place] [address]    Report Status PENDING  Incomplete  Blood Culture (routine x 2)     Status: None (Preliminary result)   Collection Time: 10/30/23  6:18 PM   Specimen: BLOOD   Result Value Ref Range Status   Specimen Description BLOOD LEFT ANTECUBITAL  Final   Special Requests   Final    BOTTLES DRAWN AEROBIC AND ANAEROBIC Blood Culture adequate volume   Culture   Final    NO GROWTH < 12 HOURS Performed at [facility], [address], [place], [place] [address]    Report Status PENDING  Incomplete      Radiology Studies last 3 days: DG OR UROLOGY CYSTO IMAGE ([facility] ONLY) Result Date: 10/30/2023 There is no interpretation for this exam.  This order is for images obtained during a surgical procedure.  Please See Surgeries Tab for more information regarding the procedure.   CT ABDOMEN PELVIS W CONTRAST Result Date: 10/30/2023 CLINICAL DATA:  Left lower quadrant pain, diarrhea EXAM: CT ABDOMEN AND PELVIS WITH CONTRAST TECHNIQUE: Multidetector CT imaging of the abdomen and pelvis was performed using the standard protocol following bolus administration of intravenous contrast. RADIATION DOSE REDUCTION: This exam was performed according to the departmental dose-optimization program which includes automated exposure control, adjustment of the mA and/or kV according to patient size and/or use of iterative reconstruction technique. CONTRAST:  OMNIPAQUE  IOHEXOL  300 MG/ML  SOLN COMPARISON:  09/14/2023 FINDINGS: Lower chest: No acute pleural or parenchymal lung disease. Hepatobiliary: No focal liver abnormality is seen. No gallstones, gallbladder wall thickening, or biliary dilatation. Pancreas: Unremarkable. No pancreatic ductal dilatation or surrounding inflammatory changes. Spleen: Normal in size without focal abnormality. Adrenals/Urinary Tract: There is an obstructing 4 mm distal left ureteral calculus, reference image 81/2, with mild left-sided hydronephrosis and hydroureter. There is delayed excretion of contrast within the left kidney. There are other bilateral nonobstructing renal calculi identified, measuring up to 4 mm on the left and 5 mm on the right. No  right-sided obstruction. The adrenals and bladder are unremarkable. Stomach/Bowel: No bowel obstruction or ileus. Normal appendix right lower quadrant. No bowel wall thickening or inflammatory change. Small hiatal hernia. Vascular/Lymphatic: No significant vascular findings are present. No enlarged abdominal or pelvic lymph nodes. Reproductive: Status post hysterectomy. No adnexal masses. Other: No free fluid or free  intraperitoneal gas. Small fat containing umbilical hernia. No bowel herniation. Musculoskeletal: No acute or destructive bony abnormalities. 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 Reconstructed images demonstrate no additional findings. IMPRESSION: 1. Obstructing 4 mm distal left ureteral calculus, with mild left-sided hydronephrosis and hydroureter. 2. Other bilateral nonobstructing renal calculi as above. 3. Small hiatal hernia. 4. Small fat containing umbilical hernia. Electronically Signed   By: Ozell Daring M.D.   On: 10/30/2023 19:10        Tikisha Molinaro, DO [facility] 10/31/2023, 3:32 PM    Dictation software may have been used to generate the above note. Typos may occur and escape review in typed/dictated notes. Please contact Dr Marsa directly for clarity if needed.  Staff may message me via secure chat in Epic  but this may not receive an immediate response,  please page me for urgent matters!  If 7PM-7AM, please contact night coverage [email or web address]

## 2023-11-01 ENCOUNTER — Other Ambulatory Visit: Payer: Self-pay

## 2023-11-01 DIAGNOSIS — N200 Calculus of kidney: Secondary | ICD-10-CM | POA: Diagnosis not present

## 2023-11-01 LAB — BASIC METABOLIC PANEL WITH GFR
Anion gap: 7 (ref 5–15)
BUN: 24 mg/dL — ABNORMAL HIGH (ref 6–20)
CO2: 20 mmol/L — ABNORMAL LOW (ref 22–32)
Calcium: 8.8 mg/dL — ABNORMAL LOW (ref 8.9–10.3)
Chloride: 112 mmol/L — ABNORMAL HIGH (ref 98–111)
Creatinine, Ser: 0.9 mg/dL (ref 0.44–1.00)
GFR, Estimated: >60 mL/min (ref >60)
Glucose, Bld: 124 mg/dL — ABNORMAL HIGH (ref 70–99)
Potassium: 3.8 mmol/L (ref 3.5–5.1)
Sodium: 139 mmol/L (ref 135–145)

## 2023-11-01 LAB — URINE CULTURE: Culture: 50000 — AB

## 2023-11-01 MED ORDER — CIPROFLOXACIN HCL 500 MG PO TABS
500.0000 mg | ORAL_TABLET | Freq: Two times a day (BID) | ORAL | 0 refills | Status: AC
  Filled 2023-11-01: qty 28, 14d supply, fill #0

## 2023-11-01 MED ORDER — OXYCODONE HCL 10 MG PO TABS
5.0000 mg | ORAL_TABLET | Freq: Four times a day (QID) | ORAL | 0 refills | Status: AC | PRN
  Filled 2023-11-01: qty 20, 5d supply, fill #0

## 2023-11-01 MED ORDER — OXYBUTYNIN CHLORIDE 5 MG PO TABS
5.0000 mg | ORAL_TABLET | Freq: Three times a day (TID) | ORAL | 0 refills | Status: AC | PRN
  Filled 2023-11-01: qty 30, 10d supply, fill #0

## 2023-11-01 MED ORDER — TAMSULOSIN HCL 0.4 MG PO CAPS
0.4000 mg | ORAL_CAPSULE | Freq: Every day | ORAL | 0 refills | Status: AC
  Filled 2023-11-01: qty 30, 30d supply, fill #0

## 2023-11-01 MED ORDER — CIPROFLOXACIN HCL 500 MG PO TABS
500.0000 mg | ORAL_TABLET | Freq: Two times a day (BID) | ORAL | Status: DC
  Administered 2023-11-01: 500 mg via ORAL
  Filled 2023-11-01: qty 1

## 2023-11-01 NOTE — Plan of Care (Signed)

## 2023-11-01 NOTE — Discharge Instructions (Signed)
 Some PCP options in [place] area- not a comprehensive list  [facility]- [phone] [facility]- [phone] [facility]- [phone] [facility]- [phone] [facility]- [phone] Nichole Molly- [phone]  or [facility] Physician Referral Line [phone]

## 2023-11-01 NOTE — Discharge Summary (Signed)
 Physician Discharge Summary   Patient: Allison Sherman MRN: [id number]  DOB: 1975/04/26   Admit:     Date of Admission: 10/30/2023 Admitted from: home   Discharge: Date of discharge: 11/01/23 Disposition: Home Condition at discharge: good  CODE STATUS: FULL      Discharge Physician: Laneta Blunt, DO [facility]     PCP: Pcp, No  Recommendations for Outpatient Follow-up:  Follow up with urology for stent removal as directed    Discharge Instructions     Diet - low sodium heart healthy   Complete by: As directed    Increase activity slowly   Complete by: As directed    No wound care   Complete by: As directed          Discharge Diagnoses: Principal Problem:   Left nephrolithiasis Active Problems:   Anxiety   Pyelonephritis of left kidney   PAF (paroxysmal atrial fibrillation) (HCC)   Hypokalemia   Hyponatremia       Hospital Course: Hospital course / significant events:   HPI: Allison Sherman is a 48 y.o. female with medical history significant of paroxysmal atrial fibrillation, recurrent nephrolithiasis, anxiety disorder, depression, who presented to the ER 09/07 with left flank and left lower quadrant pain, fever since Wednesday 09/03.   09/07: to ED. (+)sepsis w/ pyelonephritis and obstructing 4 mm distal left ureteral calculus with left-sided hydronephrosis and hydroureter. To OR for Cystoscopy, left ureteral stent placement, left retrograde pyelography  09/08: doing well, awaiting culture results  09/09: (+)enterobacter sensitive to cipro , ok for discharge   Consultants:  Urology   Procedures/Surgeries: 10/30/23:  Cystoscopy, left ureteral stent placement, left retrograde pyelography -Dr B. Herrick       ASSESSMENT & PLAN:   Sepsis d/t UTI/pyelonephritis Complicate by obstructing ureterolithiasis and associated non-obstructing nephrolithiasis Associated AKI  10/31/23 is POD2 s/p cystoscopy, left ureteral stent  placement Cipro  x 14d  Pain control Flomax  0.4 mg daily Oxybutynin  5 mg q8h prn stent discomfort Urology following --> Outpatient left URS/LL/stent exchange with Dr. Francisca in 2-3 weeks; urology scheduler will contact her after discharge to arrange May need to consider Topamax  alternatives given multiple stones seen on CT  Hypokalemia - resolved Replace as needed Monitor BMP  Hyponatremia - resolved  Monitor BMP   Leukocytosis d/t sepsis/SIRS - resolved Monitor CBC  Anxiety with depression Continue home meds    Paroxysmal atrial fibrillation Not on treatment.   Follow outpatient       Class 2 obesity based on BMI: Body mass index is 35.94 kg/m.SABRA Significantly low or high BMI is associated with higher medical risk.  Underweight - under 18  overweight - 25 to 29 obese - 30 or more Class 1 obesity: BMI of 30.0 to 34 Class 2 obesity: BMI of 35.0 to 39 Class 3 obesity: BMI of 40.0 to 49 Super Morbid Obesity: BMI 50-59 Super-super Morbid Obesity: BMI 60+ Healthy nutrition and physical activity advised as adjunct to other disease management and risk reduction treatments             Discharge Instructions  Allergies as of 11/01/2023   No Known Allergies      Medication List     STOP taking these medications    etodolac  200 MG capsule Commonly known as: LODINE        TAKE these medications    B-D 3CC LUER-LOK SYR 25GX1 25G X 1 3 ML Misc Generic drug: SYRINGE-NEEDLE (DISP) 3 ML Use as directed  to inject vitamin b-12 into muscle once a month   ciprofloxacin  500 MG tablet Commonly known as: CIPRO  Take 1 tablet (500 mg total) by mouth 2 (two) times daily for 14 days. Start taking on: November 02, 2023   cyanocobalamin  1000 MCG/ML injection Commonly known as: VITAMIN B12 Inject 1 mL (1,000 mcg total) into the muscle every 30 (thirty) days.   cyclobenzaprine  10 MG tablet Commonly known as: FLEXERIL  Take 1 tablet (10 mg total) by mouth 3 (three)  times daily as needed for muscle spasms.   DULoxetine  60 MG capsule Commonly known as: CYMBALTA  Take 1 capsule (60 mg total) by mouth daily.   ergocalciferol  1.25 MG (50000 UT) capsule Commonly known as: VITAMIN D2 Take 1 capsule (50,000 Units total) by mouth once a week.   oxybutynin  5 MG tablet Commonly known as: DITROPAN  Take 1 tablet (5 mg total) by mouth every 8 (eight) hours as needed for bladder spasms (stent discomfort).   Oxycodone  HCl 10 MG Tabs Take 0.5-1 tablets (5-10 mg total) by mouth every 6 (six) hours as needed for severe pain (pain score 7-10) or moderate pain (pain score 4-6).   SUMAtriptan  100 MG tablet Commonly known as: IMITREX  Take 1 tablet (100 mg total) by mouth every 2 (two) hours as needed for migraine. May repeat in 2 hours if headache persists or recurs. No more than 2 tablets per 24 hours.   tamsulosin  0.4 MG Caps capsule Commonly known as: FLOMAX  Take 1 capsule (0.4 mg total) by mouth daily. Start taking on: November 02, 2023   topiramate  25 MG tablet Commonly known as: Topamax  Take 1 tablet (25 mg total) by mouth 2 (two) times daily.   traZODone  50 MG tablet Commonly known as: DESYREL  Take 0.5-1 tablets (25-50 mg total) by mouth at bedtime as needed for sleep.          No Known Allergies   Subjective: pt feeling well this morning, ambulating independently, pain controlled    Discharge Exam: BP 104/67 (BP Location: Right Arm)   Pulse 61   Temp 97.6 F (36.4 C)   Resp 12   Ht 5' (1.524 m)   Wt 83.5 kg   SpO2 100%   BMI 35.94 kg/m  General: Pt is alert, awake, not in acute distress Cardiovascular: RRR, S1/S2 +, no rubs, no gallops Respiratory: CTA bilaterally, no wheezing, no rhonchi Abdominal: Soft, NT, ND, bowel sounds + Extremities: no edema, no cyanosis     The results of significant diagnostics from this hospitalization (including imaging, microbiology, ancillary and laboratory) are listed below for reference.      Microbiology: Recent Results (from the past 240 hours)  Urine Culture     Status: Abnormal   Collection Time: 10/30/23  6:08 PM   Specimen: Urine, Clean Catch  Result Value Ref Range Status   Specimen Description   Final    URINE, CLEAN CATCH Performed at [facility], [address], [place], [place] [address]    Special Requests   Final    NONE Performed at [facility], [address] [place], [place] [address]    Culture 50,000 COLONIES/mL ENTEROBACTER CLOACAE (A)  Final   Report Status 11/01/2023 FINAL  Final   Organism ID, Bacteria ENTEROBACTER CLOACAE (A)  Final      Susceptibility   Enterobacter cloacae - MIC*    CEFEPIME <=0.12 SENSITIVE Sensitive     ERTAPENEM <=0.12 SENSITIVE Sensitive     CIPROFLOXACIN  <=0.06 SENSITIVE Sensitive  GENTAMICIN <=1 SENSITIVE Sensitive     NITROFURANTOIN 64 INTERMEDIATE Intermediate     TRIMETH /SULFA  <=20 SENSITIVE Sensitive     PIP/TAZO Value in next row Sensitive ug/mL     <=4 SENSITIVEThis is a modified FDA-approved test that has been validated and its performance characteristics determined by the reporting laboratory.  This laboratory is certified under the Clinical Laboratory Improvement Amendments CLIA as qualified to perform high complexity clinical laboratory testing.    MEROPENEM Value in next row Sensitive      <=4 SENSITIVEThis is a modified FDA-approved test that has been validated and its performance characteristics determined by the reporting laboratory.  This laboratory is certified under the Clinical Laboratory Improvement Amendments CLIA as qualified to perform high complexity clinical laboratory testing.    * 50,000 COLONIES/mL ENTEROBACTER CLOACAE  Resp panel by RT-PCR (RSV, Flu A&B, Covid) Anterior Nasal Swab     Status: None   Collection Time: 10/30/23  6:18 PM   Specimen: Anterior Nasal Swab  Result Value Ref Range Status   SARS Coronavirus 2 by RT PCR NEGATIVE NEGATIVE Final    Comment:  (NOTE) SARS-CoV-2 target nucleic acids are NOT DETECTED.  The SARS-CoV-2 RNA is generally detectable in upper respiratory specimens during the acute phase of infection. The lowest concentration of SARS-CoV-2 viral copies this assay can detect is 138 copies/mL. A negative result does not preclude SARS-Cov-2 infection and should not be used as the sole basis for treatment or other patient management decisions. A negative result may occur with  improper specimen collection/handling, submission of specimen other than nasopharyngeal swab, presence of viral mutation(s) within the areas targeted by this assay, and inadequate number of viral copies(<138 copies/mL). A negative result must be combined with clinical observations, patient history, and epidemiological information. The expected result is Negative.  Fact Sheet for Patients:  BloggerCourse.com  Fact Sheet for Healthcare Providers:  SeriousBroker.it  This test is no t yet approved or cleared by the [place]  FDA and  has been authorized for detection and/or diagnosis of SARS-CoV-2 by FDA under an Emergency Use Authorization (EUA). This EUA will remain  in effect (meaning this test can be used) for the duration of the COVID-19 declaration under Section 564(b)(1) of the Act, 21 U.S.C.section 360bbb-3(b)(1), unless the authorization is terminated  or revoked sooner.       Influenza A by PCR NEGATIVE NEGATIVE Final   Influenza B by PCR NEGATIVE NEGATIVE Final    Comment: (NOTE) The Xpert Xpress SARS-CoV-2/FLU/RSV plus assay is intended as an aid in the diagnosis of influenza from Nasopharyngeal swab specimens and should not be used as a sole basis for treatment. Nasal washings and aspirates are unacceptable for Xpert Xpress SARS-CoV-2/FLU/RSV testing.  Fact Sheet for Patients: BloggerCourse.com  Fact Sheet for Healthcare  Providers: SeriousBroker.it  This test is not yet approved or cleared by the [place]  FDA and has been authorized for detection and/or diagnosis of SARS-CoV-2 by FDA under an Emergency Use Authorization (EUA). This EUA will remain in effect (meaning this test can be used) for the duration of the COVID-19 declaration under Section 564(b)(1) of the Act, 21 U.S.C. section 360bbb-3(b)(1), unless the authorization is terminated or revoked.     Resp Syncytial Virus by PCR NEGATIVE NEGATIVE Final    Comment: (NOTE) Fact Sheet for Patients: BloggerCourse.com  Fact Sheet for Healthcare Providers: SeriousBroker.it  This test is not yet approved or cleared by the [place]  FDA and has been authorized for detection and/or diagnosis of  SARS-CoV-2 by FDA under an Emergency Use Authorization (EUA). This EUA will remain in effect (meaning this test can be used) for the duration of the COVID-19 declaration under Section 564(b)(1) of the Act, 21 U.S.C. section 360bbb-3(b)(1), unless the authorization is terminated or revoked.  Performed at [facility], [address] [place], [place] [address]   Blood Culture (routine x 2)     Status: None (Preliminary result)   Collection Time: 10/30/23  6:18 PM   Specimen: BLOOD  Result Value Ref Range Status   Specimen Description BLOOD RIGHT ANTECUBITAL  Final   Special Requests   Final    BOTTLES DRAWN AEROBIC AND ANAEROBIC Blood Culture adequate volume   Culture   Final    NO GROWTH < 12 HOURS Performed at [facility], [address] [place], [place] [address]    Report Status PENDING  Incomplete  Blood Culture (routine x 2)     Status: None (Preliminary result)   Collection Time: 10/30/23  6:18 PM   Specimen: BLOOD  Result Value Ref Range Status   Specimen Description BLOOD LEFT ANTECUBITAL  Final   Special Requests   Final    BOTTLES DRAWN  AEROBIC AND ANAEROBIC Blood Culture adequate volume   Culture   Final    NO GROWTH < 12 HOURS Performed at [facility], [address] [place], [place] [address]    Report Status PENDING  Incomplete     Labs: BNP (last 3 results) No results for input(s): BNP in the last 8760 hours. Basic Metabolic Panel: Recent Labs  Lab 10/30/23 1605 10/31/23 0509 11/01/23 0332  NA 132* 136 139  K 3.0* 3.8 3.8  CL 104 110 112*  CO2 17* 18* 20*  GLUCOSE 107* 163* 124*  BUN 12 15 24*  CREATININE 1.20* 1.14* 0.90  CALCIUM 8.3* 8.4* 8.8*   Liver Function Tests: Recent Labs  Lab 10/30/23 1605 10/31/23 0509  AST 51* 28  ALT 38 29  ALKPHOS 99 83  BILITOT 0.8 0.5  PROT 7.1 6.4*  ALBUMIN 3.0* 2.5*   Recent Labs  Lab 10/30/23 1605  LIPASE 28   No results for input(s): AMMONIA in the last 168 hours. CBC: Recent Labs  Lab 10/30/23 1605 10/31/23 0509  WBC 17.6* 8.5  HGB 14.1 13.0  HCT 40.7 37.7  MCV 85.1 86.5  PLT 293 258   Cardiac Enzymes: No results for input(s): CKTOTAL, CKMB, CKMBINDEX, TROPONINI in the last 168 hours. BNP: Invalid input(s): POCBNP CBG: No results for input(s): GLUCAP in the last 168 hours. D-Dimer No results for input(s): DDIMER in the last 72 hours. Hgb A1c No results for input(s): HGBA1C in the last 72 hours. Lipid Profile No results for input(s): CHOL, HDL, LDLCALC, TRIG, CHOLHDL, LDLDIRECT in the last 72 hours. Thyroid function studies No results for input(s): TSH, T4TOTAL, T3FREE, THYROIDAB in the last 72 hours.  Invalid input(s): FREET3 Anemia work up No results for input(s): VITAMINB12, FOLATE, FERRITIN, TIBC, IRON, RETICCTPCT in the last 72 hours. Urinalysis    Component Value Date/Time   COLORURINE YELLOW (A) 10/30/2023 1809   APPEARANCEUR CLOUDY (A) 10/30/2023 1809   APPEARANCEUR Turbid (A) 05/01/2021 1202   LABSPEC 1.009 10/30/2023 1809   LABSPEC 1.011 08/28/2011 1305    PHURINE 5.0 10/30/2023 1809   GLUCOSEU NEGATIVE 10/30/2023 1809   GLUCOSEU Negative 08/28/2011 1305   HGBUR SMALL (A) 10/30/2023 1809   BILIRUBINUR NEGATIVE 10/30/2023 1809   BILIRUBINUR Negative 05/01/2021 1202   BILIRUBINUR  Negative 08/28/2011 1305   KETONESUR NEGATIVE 10/30/2023 1809   PROTEINUR NEGATIVE 10/30/2023 1809   NITRITE NEGATIVE 10/30/2023 1809   LEUKOCYTESUR LARGE (A) 10/30/2023 1809   LEUKOCYTESUR Negative 08/28/2011 1305   Sepsis Labs Recent Labs  Lab 10/30/23 1605 10/31/23 0509  WBC 17.6* 8.5   Microbiology Recent Results (from the past 240 hours)  Urine Culture     Status: Abnormal   Collection Time: 10/30/23  6:08 PM   Specimen: Urine, Clean Catch  Result Value Ref Range Status   Specimen Description   Final    URINE, CLEAN CATCH Performed at [facility], [address] [place], [place] [address]    Special Requests   Final    NONE Performed at [facility], [address] [place], [place] [address]    Culture 50,000 COLONIES/mL ENTEROBACTER CLOACAE (A)  Final   Report Status 11/01/2023 FINAL  Final   Organism ID, Bacteria ENTEROBACTER CLOACAE (A)  Final      Susceptibility   Enterobacter cloacae - MIC*    CEFEPIME <=0.12 SENSITIVE Sensitive     ERTAPENEM <=0.12 SENSITIVE Sensitive     CIPROFLOXACIN  <=0.06 SENSITIVE Sensitive     GENTAMICIN <=1 SENSITIVE Sensitive     NITROFURANTOIN 64 INTERMEDIATE Intermediate     TRIMETH /SULFA  <=20 SENSITIVE Sensitive     PIP/TAZO Value in next row Sensitive ug/mL     <=4 SENSITIVEThis is a modified FDA-approved test that has been validated and its performance characteristics determined by the reporting laboratory.  This laboratory is certified under the Clinical Laboratory Improvement Amendments CLIA as qualified to perform high complexity clinical laboratory testing.    MEROPENEM Value in next row Sensitive      <=4 SENSITIVEThis is a modified FDA-approved test that has been validated  and its performance characteristics determined by the reporting laboratory.  This laboratory is certified under the Clinical Laboratory Improvement Amendments CLIA as qualified to perform high complexity clinical laboratory testing.    * 50,000 COLONIES/mL ENTEROBACTER CLOACAE  Resp panel by RT-PCR (RSV, Flu A&B, Covid) Anterior Nasal Swab     Status: None   Collection Time: 10/30/23  6:18 PM   Specimen: Anterior Nasal Swab  Result Value Ref Range Status   SARS Coronavirus 2 by RT PCR NEGATIVE NEGATIVE Final    Comment: (NOTE) SARS-CoV-2 target nucleic acids are NOT DETECTED.  The SARS-CoV-2 RNA is generally detectable in upper respiratory specimens during the acute phase of infection. The lowest concentration of SARS-CoV-2 viral copies this assay can detect is 138 copies/mL. A negative result does not preclude SARS-Cov-2 infection and should not be used as the sole basis for treatment or other patient management decisions. A negative result may occur with  improper specimen collection/handling, submission of specimen other than nasopharyngeal swab, presence of viral mutation(s) within the areas targeted by this assay, and inadequate number of viral copies(<138 copies/mL). A negative result must be combined with clinical observations, patient history, and epidemiological information. The expected result is Negative.  Fact Sheet for Patients:  BloggerCourse.com  Fact Sheet for Healthcare Providers:  SeriousBroker.it  This test is no t yet approved or cleared by the [place]  FDA and  has been authorized for detection and/or diagnosis of SARS-CoV-2 by FDA under an Emergency Use Authorization (EUA). This EUA will remain  in effect (meaning this test can be used) for the duration of the COVID-19 declaration under Section 564(b)(1) of the Act, 21 U.S.C.section 360bbb-3(b)(1), unless the authorization is terminated  or revoked sooner.        Influenza A by PCR NEGATIVE NEGATIVE Final   Influenza B by PCR NEGATIVE NEGATIVE Final    Comment: (NOTE) The Xpert Xpress SARS-CoV-2/FLU/RSV plus assay is intended as an aid in the diagnosis of influenza from Nasopharyngeal swab specimens and should not be used as a sole basis for treatment. Nasal washings and aspirates are unacceptable for Xpert Xpress SARS-CoV-2/FLU/RSV testing.  Fact Sheet for Patients: BloggerCourse.com  Fact Sheet for Healthcare Providers: SeriousBroker.it  This test is not yet approved or cleared by the [place]  FDA and has been authorized for detection and/or diagnosis of SARS-CoV-2 by FDA under an Emergency Use Authorization (EUA). This EUA will remain in effect (meaning this test can be used) for the duration of the COVID-19 declaration under Section 564(b)(1) of the Act, 21 U.S.C. section 360bbb-3(b)(1), unless the authorization is terminated or revoked.     Resp Syncytial Virus by PCR NEGATIVE NEGATIVE Final    Comment: (NOTE) Fact Sheet for Patients: BloggerCourse.com  Fact Sheet for Healthcare Providers: SeriousBroker.it  This test is not yet approved or cleared by the [place]  FDA and has been authorized for detection and/or diagnosis of SARS-CoV-2 by FDA under an Emergency Use Authorization (EUA). This EUA will remain in effect (meaning this test can be used) for the duration of the COVID-19 declaration under Section 564(b)(1) of the Act, 21 U.S.C. section 360bbb-3(b)(1), unless the authorization is terminated or revoked.  Performed at [facility], [address] [place], [place] [address]   Blood Culture (routine x 2)     Status: None (Preliminary result)   Collection Time: 10/30/23  6:18 PM   Specimen: BLOOD  Result Value Ref Range Status   Specimen Description BLOOD RIGHT ANTECUBITAL  Final   Special  Requests   Final    BOTTLES DRAWN AEROBIC AND ANAEROBIC Blood Culture adequate volume   Culture   Final    NO GROWTH < 12 HOURS Performed at [facility], [address], [place], [place] [address]    Report Status PENDING  Incomplete  Blood Culture (routine x 2)     Status: None (Preliminary result)   Collection Time: 10/30/23  6:18 PM   Specimen: BLOOD  Result Value Ref Range Status   Specimen Description BLOOD LEFT ANTECUBITAL  Final   Special Requests   Final    BOTTLES DRAWN AEROBIC AND ANAEROBIC Blood Culture adequate volume   Culture   Final    NO GROWTH < 12 HOURS Performed at [facility], [address] [place], [place] [address]    Report Status PENDING  Incomplete   Imaging DG OR UROLOGY CYSTO IMAGE ([facility] ONLY) Result Date: 10/30/2023 There is no interpretation for this exam.  This order is for images obtained during a surgical procedure.  Please See Surgeries Tab for more information regarding the procedure.   CT ABDOMEN PELVIS W CONTRAST Result Date: 10/30/2023 CLINICAL DATA:  Left lower quadrant pain, diarrhea EXAM: CT ABDOMEN AND PELVIS WITH CONTRAST TECHNIQUE: Multidetector CT imaging of the abdomen and pelvis was performed using the standard protocol following bolus administration of intravenous contrast. RADIATION DOSE REDUCTION: This exam was performed according to the departmental dose-optimization program which includes automated exposure control, adjustment of the mA and/or kV according to patient size and/or use of iterative reconstruction technique. CONTRAST:  OMNIPAQUE  IOHEXOL  300 MG/ML  SOLN COMPARISON:  09/14/2023 FINDINGS: Lower chest: No acute pleural or parenchymal lung disease. Hepatobiliary: No  focal liver abnormality is seen. No gallstones, gallbladder wall thickening, or biliary dilatation. Pancreas: Unremarkable. No pancreatic ductal dilatation or surrounding inflammatory changes. Spleen: Normal in size without focal abnormality.  Adrenals/Urinary Tract: There is an obstructing 4 mm distal left ureteral calculus, reference image 81/2, with mild left-sided hydronephrosis and hydroureter. There is delayed excretion of contrast within the left kidney. There are other bilateral nonobstructing renal calculi identified, measuring up to 4 mm on the left and 5 mm on the right. No right-sided obstruction. The adrenals and bladder are unremarkable. Stomach/Bowel: No bowel obstruction or ileus. Normal appendix right lower quadrant. No bowel wall thickening or inflammatory change. Small hiatal hernia. Vascular/Lymphatic: No significant vascular findings are present. No enlarged abdominal or pelvic lymph nodes. Reproductive: Status post hysterectomy. No adnexal masses. Other: No free fluid or free intraperitoneal gas. Small fat containing umbilical hernia. No bowel herniation. Musculoskeletal: No acute or destructive bony abnormalities. 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 Reconstructed images demonstrate no additional findings. IMPRESSION: 1. Obstructing 4 mm distal left ureteral calculus, with mild left-sided hydronephrosis and hydroureter. 2. Other bilateral nonobstructing renal calculi as above. 3. Small hiatal hernia. 4. Small fat containing umbilical hernia. Electronically Signed   By: Ozell Daring M.D.   On: 10/30/2023 19:10      Time coordinating discharge: over 30 minutes  SIGNED:  Sonika Levins DO [facility]

## 2023-11-01 NOTE — TOC CM/SW Note (Signed)
 Transition of Care [facility]) - Inpatient Brief Assessment   Patient Details  Name: Allison Sherman MRN: [id number] Date of Birth: Mar 23, 1975  [facility] CM/SW Contact:    Lauraine JAYSON Carpen, LCSW Phone Number: 11/01/2023, 10:07 AM   Clinical Narrative: CSW reviewed chart. No PCP. CSW added list to AVS. No other TOC needs identified at this time. CSW will continue to follow progress. Please place [facility] consult if any needs arise.  Transition of Care Asessment: Insurance and Status: Insurance coverage has been reviewed Patient has primary care physician: No Home environment has been reviewed: Single family home Prior level of function:: Not documented Prior/Current Home Services: No current home services Social Drivers of Health Review: SDOH reviewed no interventions necessary Readmission risk has been reviewed: Yes Transition of care needs: no transition of care needs at this time

## 2023-11-02 ENCOUNTER — Other Ambulatory Visit: Payer: Self-pay

## 2023-11-02 DIAGNOSIS — N201 Calculus of ureter: Secondary | ICD-10-CM

## 2023-11-02 NOTE — Progress Notes (Unsigned)
 Surgical Physician Order Form [facility] [place]  Dr. Francisca * Scheduling expectation : 2-3 weeks  *Length of Case:   *Clearance needed: no  *Anticoagulation Instructions: N/A  *Aspirin  Instructions: N/A  *Post-op visit Date/Instructions:  TBD  *Diagnosis: Left Ureteral Stone  *Procedure: left  Ureteroscopy w/laser lithotripsy & stent exchange (47643)   Additional orders: N/A  -Admit type: OUTpatient  -Anesthesia: General  -VTE Prophylaxis Standing Order SCD's       Other:   -Standing Lab Orders Per Anesthesia    Lab other: None  -Standing Test orders EKG/Chest x-ray per Anesthesia       Test other:   - Medications:  Ancef 2gm IV  -Other orders:  N/A

## 2023-11-04 ENCOUNTER — Telehealth: Payer: Self-pay

## 2023-11-04 ENCOUNTER — Other Ambulatory Visit: Payer: Self-pay | Admitting: Medical Genetics

## 2023-11-04 LAB — CULTURE, BLOOD (ROUTINE X 2)
Culture: NO GROWTH
Culture: NO GROWTH
Special Requests: ADEQUATE
Special Requests: ADEQUATE

## 2023-11-04 NOTE — Telephone Encounter (Signed)
 Per Dr. Francisca, Patient is to be scheduled for  Left Ureteroscopy with Laser Lithotripsy and Stent Exchange   Mrs. Kinoshita was contacted and possible surgical dates were discussed, Monday September 29th, 2025 was agreed upon for surgery.   Patient was directed to call [phone] between 1-3pm the day before surgery to find out surgical arrival time.  Instructions were given not to eat or drink from midnight on the night before surgery and have a driver for the day of surgery. On the surgery day patient was instructed to enter through the Medical Mall entrance of [facility] report the Same Day Surgery desk.   Pre-Admit Testing will be in contact via phone to set up an interview with the anesthesia team to review your history and medications prior to surgery.   Reminder of this information was sent via MyChart to the patient.

## 2023-11-04 NOTE — Progress Notes (Signed)
   [facility] Surgical Posting Form  Surgery Date: Date: 11/21/2023  Surgeon: Dr. Redell Burnet, MD  Inpt ( No  )   Outpt (Yes)   Obs ( No  )   Diagnosis: N20.1 Left Ureteral Stone  -CPT: [phone]  Surgery: Left Ureteroscopy with Laser Lithotripsy and Stent Exchange  Stop Anticoagulations: No  Cardiac/Medical/Pulmonary Clearance needed: no  *Orders entered into EPIC  Date: 11/04/23   *Case booked in [place]  Date: 11/03/2023  *Notified pt of Surgery: Date: 11/03/2023  PRE-OP UA & CX: no  *Placed into Prior Authorization Work [place] Date: 11/04/23  Assistant/laser/rep:No

## 2023-11-14 ENCOUNTER — Encounter
Admission: RE | Admit: 2023-11-14 | Discharge: 2023-11-14 | Disposition: A | Discharge status: Home / Self Care | Source: Ambulatory Visit | Attending: Urology | Admitting: Urology

## 2023-11-14 ENCOUNTER — Other Ambulatory Visit: Payer: Self-pay

## 2023-11-14 ENCOUNTER — Other Ambulatory Visit

## 2023-11-14 HISTORY — DX: Migraine, unspecified, not intractable, without status migrainosus: G43.909

## 2023-11-14 HISTORY — DX: Tubulo-interstitial nephritis, not specified as acute or chronic: N12

## 2023-11-14 NOTE — Patient Instructions (Signed)
 Your procedure is scheduled on:11-21-23 Monday Report to the Registration Desk on the 1st floor of the Medical Mall.Then proceed to the 2nd floor Surgery Desk To find out your arrival time, please call [phone] between 1PM - 3PM on:11-18-23 Friday If your arrival time is 6:00 am, do not arrive before that time as the Medical Mall entrance doors do not open until 6:00 am.  REMEMBER: Instructions that are not followed completely may result in serious medical risk, up to and including death; or upon the discretion of your surgeon and anesthesiologist your surgery may need to be rescheduled.  Do not eat food OR drink liquids after midnight the night before surgery.  No gum chewing or hard candies.  One week prior to surgery:Stop NOW (11-14-23) Stop Anti-inflammatories (NSAIDS) such as Advil , Aleve, Ibuprofen , Motrin , Naproxen, Naprosyn and Aspirin  based products such as Excedrin, Goody's Powder, BC Powder. Stop ANY OVER THE COUNTER supplements until after surgery.  You may however, continue to take Tylenol /Oxycodone  if needed for pain up until the day of surgery.  Continue taking all of your other prescription medications up until the day of surgery.  ON THE DAY OF SURGERY ONLY TAKE THESE MEDICATIONS WITH SIPS OF WATER: -tamsulosin  (FLOMAX )  -you may take Oxycodone  for pain if needed  No Alcohol for 24 hours before or after surgery.  No Smoking including e-cigarettes for 24 hours before surgery.  No chewable tobacco products for at least 6 hours before surgery.  No nicotine patches on the day of surgery.  Do not use any recreational drugs for at least a week (preferably 2 weeks) before your surgery.  Please be advised that the combination of cocaine and anesthesia may have negative outcomes, up to and including death. If you test positive for cocaine, your surgery will be cancelled.  On the morning of surgery brush your teeth with toothpaste and water, you may rinse your mouth with  mouthwash if you wish. Do not swallow any toothpaste or mouthwash.  Do not wear jewelry, make-up, hairpins, clips or nail polish.  For welded (permanent) jewelry: bracelets, anklets, waist bands, etc.  Please have this removed prior to surgery.  If it is not removed, there is a chance that hospital personnel will need to cut it off on the day of surgery.  Do not wear lotions, powders, or perfumes.   Do not shave body hair from the neck down 48 hours before surgery.  Contact lenses, hearing aids and dentures may not be worn into surgery.  Do not bring valuables to the hospital. [facility] is not responsible for any missing/lost belongings or valuables.   Notify your doctor if there is any change in your medical condition (cold, fever, infection).  Wear comfortable clothing (specific to your surgery type) to the hospital.  After surgery, you can help prevent lung complications by doing breathing exercises.  Take deep breaths and cough every 1-2 hours. Your doctor may order a device called an Incentive Spirometer to help you take deep breaths. When coughing or sneezing, hold a pillow firmly against your incision with both hands. This is called "splinting." Doing this helps protect your incision. It also decreases belly discomfort.  If you are being admitted to the hospital overnight, leave your suitcase in the car. After surgery it may be brought to your room.  In case of increased patient census, it may be necessary for you, the patient, to continue your postoperative care in the Same Day Surgery department.  If you are  being discharged the day of surgery, you will not be allowed to drive home. You will need a responsible individual to drive you home and stay with you for 24 hours after surgery.   If you are taking public transportation, you will need to have a responsible individual with you.  Please call the Pre-admissions Testing Dept. at [phone] if you have any questions  about these instructions.  Surgery Visitation Policy:  Patients having surgery or a procedure may have two visitors.  Children under the age of 27 must have an adult with them who is not the patient.   Merchandiser, retail to address health-related social needs:  [email or web address]

## 2023-11-17 ENCOUNTER — Other Ambulatory Visit

## 2023-11-19 ENCOUNTER — Emergency Department
Admission: EM | Admit: 2023-11-19 | Discharge: 2023-11-19 | Disposition: A | Discharge status: Home / Self Care | Attending: Emergency Medicine | Admitting: Emergency Medicine

## 2023-11-19 ENCOUNTER — Other Ambulatory Visit: Payer: Self-pay

## 2023-11-19 DIAGNOSIS — S90851A Superficial foreign body, right foot, initial encounter: Secondary | ICD-10-CM | POA: Insufficient documentation

## 2023-11-19 DIAGNOSIS — S99921A Unspecified injury of right foot, initial encounter: Secondary | ICD-10-CM | POA: Diagnosis present

## 2023-11-19 DIAGNOSIS — S99929A Unspecified injury of unspecified foot, initial encounter: Secondary | ICD-10-CM

## 2023-11-19 DIAGNOSIS — W458XXA Other foreign body or object entering through skin, initial encounter: Secondary | ICD-10-CM | POA: Diagnosis not present

## 2023-11-19 MED ORDER — FLUCONAZOLE 150 MG PO TABS: 150.0000 mg | ORAL_TABLET | Freq: Every day | ORAL | 0 refills | Status: AC

## 2023-11-19 MED ORDER — AMOXICILLIN-POT CLAVULANATE 875-125 MG PO TABS: 1.0000 | ORAL_TABLET | Freq: Two times a day (BID) | ORAL | 0 refills | Status: AC

## 2023-11-19 MED ORDER — LIDOCAINE HCL (PF) 1 % IJ SOLN
5.0000 mL | Freq: Once | INTRAMUSCULAR | Status: AC
  Administered 2023-11-19: 5 mL
  Filled 2023-11-19: qty 5

## 2023-11-19 NOTE — Discharge Instructions (Addendum)
 Your evaluated in the ED for a fishhook to your foot.  We were successfully able to remove the patient hook.  Please take antibiotics as prescribed.  Keep area clean with soap and water and cover with a Band-Aid.  You can also apply antibiotic ointment or cream to the area.  Pain control:  Ibuprofen  (motrin /aleve/advil ) - You can take 3 tablets (600 mg) every 6 hours as needed for pain/fever.  Acetaminophen  (tylenol ) - You can take 2 extra strength tablets (1000 mg) every 6 hours as needed for pain/fever.  You can alternate these medications or take them together.  Make sure you eat food/drink water when taking these medications.

## 2023-11-19 NOTE — ED Triage Notes (Signed)
 Pt to ED for barbed fish hook stuck in R foot. Last Tdap within 10 years. No pain.

## 2023-11-19 NOTE — ED Provider Notes (Signed)
 [facility] Emergency Department Provider Note     Event Date/Time   First MD Initiated Contact with Patient 11/19/23 1610     (approximate)   History   fish hook in foot   HPI  Allison Sherman is a 48 y.o. female presents to the ED for evaluation of a 3 barbed fish hook stuck in the bottom of her right foot. Patient reports she stepped on fish hook that was previously stuck in the mouth of her dog. Sensation remains intact. Tetanus updated in 2024. No other complaint.      Physical Exam   Triage Vital Signs: ED Triage Vitals  Encounter Vitals Group     BP 11/19/23 1520 (!) 150/89     Girls Systolic BP Percentile --      Girls Diastolic BP Percentile --      Boys Systolic BP Percentile --      Boys Diastolic BP Percentile --      Pulse Rate 11/19/23 1520 (!) 107     Resp 11/19/23 1520 16     Temp 11/19/23 1520 99.1 F (37.3 C)     Temp Source 11/19/23 1520 Oral     SpO2 11/19/23 1520 95 %     Weight 11/19/23 1519 185 lb (83.9 kg)     Height 11/19/23 1519 5' (1.524 m)     Head Circumference --      Peak Flow --      Pain Score 11/19/23 1518 0     Pain Loc --      Pain Education --      Exclude from Growth Chart --     Most recent vital signs: Vitals:   11/19/23 1520  BP: (!) 150/89  Pulse: (!) 107  Resp: 16  Temp: 99.1 F (37.3 C)  SpO2: 95%    General Awake, no distress.  HEENT NCAT.  CV:  Good peripheral perfusion.  RESP:  Normal effort.  ABD:  No distention.  Other:  Sole of right foot has 3 pronged fishhook pierced into skin. Neurovascular status intact all throughout.   ED Results / Procedures / Treatments   Labs (all labs ordered are listed, but only abnormal results are displayed) Labs Reviewed - No data to display  No results found.  PROCEDURES:  Critical Care performed: No  .Foreign Body Removal  Date/Time: 11/19/2023 5:38 PM  Performed by: Margrette Rebbeca LABOR, PA-C Authorized by: Margrette Rebbeca LABOR, PA-C   Consent: Verbal consent obtained Risks and benefits: risks, benefits and alternatives were discussed Consent given by: patient Body area: skin General location: lower extremity Location details: right foot Anesthesia: local infiltration  Anesthesia: Local Anesthetic: lidocaine  1% without epinephrine  Sedation: Patient sedated: no  Localization method: visualized Removal mechanism: hemostat Depth: subcutaneous Complexity: simple 1 objects recovered. Objects recovered: fishhook Post-procedure assessment: foreign body removed Patient tolerance: patient tolerated the procedure well with no immediate complications   MEDICATIONS ORDERED IN ED: Medications  lidocaine  (PF) (XYLOCAINE ) 1 % injection 5 mL (5 mLs Infiltration Given by Other 11/19/23 1722)   IMPRESSION / MDM / ASSESSMENT AND PLAN / ED COURSE  I reviewed the triage vital signs and the nursing notes.                               48 y.o. female presents to the emergency department for evaluation and treatment of fishhook in right foot. See HPI  for further details.    Patient's presentation is most consistent with acute, uncomplicated illness.  Patient is alert and oriented. Foreign body removed successfully. Please see procedure note. Local anesthetic and fish hook pulled out of entry point due to complex barb. Patient tolerated well. Will place on antibiotics - augmentin for coverage of animal saliva as barb was stuck in dogs mouth prior to being stuck in patients foot. Patient is prone to yeast infections. Will send diflucan. The patient is in stable and satisfactory condition for discharge home. ED precautions discussed.   FINAL CLINICAL IMPRESSION(S) / ED DIAGNOSES   Final diagnoses:  Fish hook in foot   Rx / DC Orders   ED Discharge Orders          Ordered    amoxicillin-clavulanate (AUGMENTIN) 875-125 MG tablet  2 times daily        11/19/23 1710    fluconazole (DIFLUCAN) 150 MG tablet  Daily         11/19/23 1710           Note:  This document was prepared using Dragon voice recognition software and may include unintentional dictation errors.    Margrette, Jemery Stacey A, PA-C 11/19/23 1746    Dorothyann Drivers, MD 11/20/23 1901

## 2023-11-20 MED ORDER — CHLORHEXIDINE GLUCONATE 0.12 % MT SOLN
15.0000 mL | Freq: Once | OROMUCOSAL | Status: AC
Start: 2023-11-20 — End: 2023-11-21
  Administered 2023-11-21: 15 mL via OROMUCOSAL

## 2023-11-20 MED ORDER — CEFAZOLIN SODIUM-DEXTROSE 2-4 GM/100ML-% IV SOLN
2.0000 g | INTRAVENOUS | Status: AC
  Administered 2023-11-21: 2 g via INTRAVENOUS

## 2023-11-20 MED ORDER — ORAL CARE MOUTH RINSE: 15.0000 mL | Freq: Once | OROMUCOSAL | Status: AC

## 2023-11-20 MED ORDER — LACTATED RINGERS IV SOLN: INTRAVENOUS | Status: DC

## 2023-11-21 ENCOUNTER — Encounter
Admission: RE | Disposition: A | Discharge status: Home / Self Care | Payer: Self-pay | Source: Home / Self Care | Attending: Urology

## 2023-11-21 ENCOUNTER — Other Ambulatory Visit: Payer: Self-pay

## 2023-11-21 ENCOUNTER — Ambulatory Visit: Payer: Self-pay | Admitting: Urgent Care

## 2023-11-21 ENCOUNTER — Ambulatory Visit
Admission: RE | Admit: 2023-11-21 | Discharge: 2023-11-21 | Disposition: A | Discharge status: Home / Self Care | Attending: Urology | Admitting: Urology

## 2023-11-21 ENCOUNTER — Ambulatory Visit

## 2023-11-21 DIAGNOSIS — N201 Calculus of ureter: Secondary | ICD-10-CM | POA: Diagnosis present

## 2023-11-21 DIAGNOSIS — F419 Anxiety disorder, unspecified: Secondary | ICD-10-CM | POA: Diagnosis not present

## 2023-11-21 DIAGNOSIS — I4891 Unspecified atrial fibrillation: Secondary | ICD-10-CM | POA: Insufficient documentation

## 2023-11-21 DIAGNOSIS — R519 Headache, unspecified: Secondary | ICD-10-CM | POA: Insufficient documentation

## 2023-11-21 DIAGNOSIS — N200 Calculus of kidney: Secondary | ICD-10-CM | POA: Diagnosis not present

## 2023-11-21 DIAGNOSIS — Z833 Family history of diabetes mellitus: Secondary | ICD-10-CM | POA: Diagnosis not present

## 2023-11-21 DIAGNOSIS — N12 Tubulo-interstitial nephritis, not specified as acute or chronic: Secondary | ICD-10-CM

## 2023-11-21 HISTORY — PX: CYSTOSCOPY/URETEROSCOPY/HOLMIUM LASER/STENT PLACEMENT: SHX6546

## 2023-11-21 SURGERY — CYSTOSCOPY/URETEROSCOPY/HOLMIUM LASER/STENT PLACEMENT
Anesthesia: General | Site: Ureter | Laterality: Left

## 2023-11-21 MED ORDER — DEXAMETHASONE SODIUM PHOSPHATE 10 MG/ML IJ SOLN
INTRAMUSCULAR | Status: DC | PRN
  Administered 2023-11-21: 4 mg via INTRAVENOUS

## 2023-11-21 MED ORDER — ACETAMINOPHEN 10 MG/ML IV SOLN: 1000.0000 mg | Freq: Once | INTRAVENOUS | Status: DC | PRN

## 2023-11-21 MED ORDER — ROCURONIUM BROMIDE 100 MG/10ML IV SOLN
INTRAVENOUS | Status: DC | PRN
Start: 2023-11-21 — End: 2023-11-21
  Administered 2023-11-21: 40 mg via INTRAVENOUS

## 2023-11-21 MED ORDER — KETOROLAC TROMETHAMINE 30 MG/ML IJ SOLN
INTRAMUSCULAR | Status: DC | PRN
  Administered 2023-11-21: 15 mg via INTRAVENOUS

## 2023-11-21 MED ORDER — DROPERIDOL 2.5 MG/ML IJ SOLN: 0.6250 mg | Freq: Once | INTRAMUSCULAR | Status: DC | PRN

## 2023-11-21 MED ORDER — MIDAZOLAM HCL 2 MG/2ML IJ SOLN
INTRAMUSCULAR | Status: AC
  Filled 2023-11-21: qty 2

## 2023-11-21 MED ORDER — OXYCODONE HCL 5 MG PO TABS
ORAL_TABLET | ORAL | Status: AC
  Filled 2023-11-21: qty 1

## 2023-11-21 MED ORDER — LIDOCAINE HCL (PF) 2 % IJ SOLN
INTRAMUSCULAR | Status: AC
  Filled 2023-11-21: qty 5

## 2023-11-21 MED ORDER — FENTANYL CITRATE (PF) 100 MCG/2ML IJ SOLN: 25.0000 ug | INTRAMUSCULAR | Status: DC | PRN

## 2023-11-21 MED ORDER — SUGAMMADEX SODIUM 200 MG/2ML IV SOLN
INTRAVENOUS | Status: DC | PRN
  Administered 2023-11-21: 200 mg via INTRAVENOUS

## 2023-11-21 MED ORDER — OXYCODONE HCL 5 MG PO TABS
5.0000 mg | ORAL_TABLET | Freq: Once | ORAL | Status: AC | PRN
  Administered 2023-11-21: 5 mg via ORAL

## 2023-11-21 MED ORDER — OXYCODONE HCL 5 MG/5ML PO SOLN: 5.0000 mg | Freq: Once | ORAL | Status: AC | PRN

## 2023-11-21 MED ORDER — STERILE WATER FOR IRRIGATION IR SOLN
Status: DC | PRN
  Administered 2023-11-21: 500 mL

## 2023-11-21 MED ORDER — SULFAMETHOXAZOLE-TRIMETHOPRIM 800-160 MG PO TABS: 1.0000 | ORAL_TABLET | Freq: Once | ORAL | 0 refills | Status: AC | PRN

## 2023-11-21 MED ORDER — ONDANSETRON HCL 4 MG/2ML IJ SOLN
INTRAMUSCULAR | Status: DC | PRN
  Administered 2023-11-21: 4 mg via INTRAVENOUS

## 2023-11-21 MED ORDER — CHLORHEXIDINE GLUCONATE 0.12 % MT SOLN
OROMUCOSAL | Status: AC
  Filled 2023-11-21: qty 15

## 2023-11-21 MED ORDER — PROPOFOL 10 MG/ML IV BOLUS
INTRAVENOUS | Status: AC
  Filled 2023-11-21: qty 20

## 2023-11-21 MED ORDER — PROPOFOL 10 MG/ML IV BOLUS
INTRAVENOUS | Status: DC | PRN
  Administered 2023-11-21: 150 mg via INTRAVENOUS

## 2023-11-21 MED ORDER — MIDAZOLAM HCL 2 MG/2ML IJ SOLN
INTRAMUSCULAR | Status: DC | PRN
  Administered 2023-11-21: 2 mg via INTRAVENOUS

## 2023-11-21 MED ORDER — CEFAZOLIN SODIUM-DEXTROSE 2-4 GM/100ML-% IV SOLN
INTRAVENOUS | Status: AC
  Filled 2023-11-21: qty 100

## 2023-11-21 MED ORDER — IOHEXOL 180 MG/ML  SOLN
INTRAMUSCULAR | Status: DC | PRN
  Administered 2023-11-21: 10 mL

## 2023-11-21 MED ORDER — FENTANYL CITRATE (PF) 100 MCG/2ML IJ SOLN
INTRAMUSCULAR | Status: AC
  Filled 2023-11-21: qty 2

## 2023-11-21 MED ORDER — SODIUM CHLORIDE 0.9 % IR SOLN
Status: DC | PRN
  Administered 2023-11-21: 3000 mL

## 2023-11-21 MED ORDER — FENTANYL CITRATE (PF) 100 MCG/2ML IJ SOLN
INTRAMUSCULAR | Status: DC | PRN
  Administered 2023-11-21 (×2): 50 ug via INTRAVENOUS

## 2023-11-21 MED ORDER — LIDOCAINE HCL (CARDIAC) PF 100 MG/5ML IV SOSY
PREFILLED_SYRINGE | INTRAVENOUS | Status: DC | PRN
  Administered 2023-11-21: 50 mg via INTRAVENOUS

## 2023-11-21 SURGICAL SUPPLY — 22 items
ADHESIVE MASTISOL STRL (MISCELLANEOUS) IMPLANT
BAG DRAIN SIEMENS DORNER NS (MISCELLANEOUS) ×1 IMPLANT
BRUSH SCRUB EZ 4% CHG (MISCELLANEOUS) ×1 IMPLANT
CATH URET FLEX-TIP 2 LUMEN 10F (CATHETERS) IMPLANT
CATH URETL OPEN 5X70 (CATHETERS) IMPLANT
DRAPE UTILITY 15X26 TOWEL STRL (DRAPES) ×1 IMPLANT
DRSG TEGADERM 2-3/8X2-3/4 SM (GAUZE/BANDAGES/DRESSINGS) IMPLANT
FIBER LASER MOSES 200 DFL (Laser) IMPLANT
GOWN STRL REUS W/ TWL LRG LVL3 (GOWN DISPOSABLE) ×1 IMPLANT
GOWN STRL REUS W/ TWL XL LVL3 (GOWN DISPOSABLE) ×1 IMPLANT
GUIDEWIRE STR DUAL SENSOR (WIRE) ×1 IMPLANT
KIT TURNOVER CYSTO (KITS) ×1 IMPLANT
PACK CYSTO AR (MISCELLANEOUS) ×1 IMPLANT
SET CYSTO W/LG BORE CLAMP LF (SET/KITS/TRAYS/PACK) ×1 IMPLANT
SHEATH NAVIGATOR HD 12/14X36 (SHEATH) IMPLANT
SOL .9 NS 3000ML IRR UROMATIC (IV SOLUTION) ×1 IMPLANT
STENT URET 6FRX24 CONTOUR (STENTS) IMPLANT
STENT URET 6FRX26 CONTOUR (STENTS) IMPLANT
SURGILUBE 2OZ TUBE FLIPTOP (MISCELLANEOUS) ×1 IMPLANT
SYR 10ML LL (SYRINGE) ×1 IMPLANT
VALVE UROSEAL ADJ ENDO (VALVE) IMPLANT
WATER STERILE IRR 500ML POUR (IV SOLUTION) ×1 IMPLANT

## 2023-11-21 NOTE — Anesthesia Procedure Notes (Signed)
 Procedure Name: Intubation Date/Time: 11/21/2023 11:21 AM  Performed by: Myra Lawless, CRNAPre-anesthesia Checklist: Patient identified, Patient being monitored, Timeout performed, Emergency Drugs available and Suction available Patient Re-evaluated:Patient Re-evaluated prior to induction Oxygen Delivery Method: Circle system utilized Preoxygenation: Pre-oxygenation with 100% oxygen Induction Type: IV induction Ventilation: Mask ventilation without difficulty Laryngoscope Size: Mac and 3 Grade View: Grade I Tube type: Oral Tube size: 7.0 mm Number of attempts: 1 Airway Equipment and Method: Stylet Placement Confirmation: ETT inserted through vocal cords under direct vision, positive ETCO2 and breath sounds checked- equal and bilateral Secured at: 21 cm Tube secured with: Tape Dental Injury: Teeth and Oropharynx as per pre-operative assessment

## 2023-11-21 NOTE — Transfer of Care (Signed)
 Immediate Anesthesia Transfer of Care Note  Patient: Allison Sherman  Procedure(s) Performed: CYSTOSCOPY/URETEROSCOPY/HOLMIUM LASER/STENT PLACEMENT (Left: Ureter)  Patient Location: PACU  Anesthesia Type:General  Level of Consciousness: drowsy  Airway & Oxygen Therapy: Patient Spontanous Breathing and Patient connected to face mask oxygen  Post-op Assessment: Report given to RN  Post vital signs: stable  Last Vitals:  Vitals Value Taken Time  BP 123/77 11/21/23 11:56  Temp 36.2 C 11/21/23 11:56  Pulse 82 11/21/23 11:57  Resp 16 11/21/23 11:57  SpO2 100 % 11/21/23 11:57  Vitals shown include unfiled device data.  Last Pain:  Vitals:   11/21/23 1052  TempSrc: Tympanic  PainSc: 5          Complications: No notable events documented.

## 2023-11-21 NOTE — H&P (Signed)
   11/21/23 10:52 AM   Allison Sherman June 22, 1975 [id number]  CC: Left ureteral and renal stones  HPI: 48 year old female with left distal ureteral stone and left renal stones previously stented for infection here today for definitive management with ureteroscopy.   PMH: Past Medical History:  Diagnosis Date   A-fib (HCC)    in her 20's-no episodes since   Anxiety    Depression    Migraines    Pyelonephritis of left kidney    Renal disorder    kidney failure at 23    Surgical History: Past Surgical History:  Procedure Laterality Date   ABDOMINAL HYSTERECTOMY     partial   BLADDER SUSPENSION     CYSTOSCOPY WITH STENT PLACEMENT Left 10/30/2023   Procedure: CYSTOSCOPY, WITH STENT INSERTION;  Surgeon: Cam Morene ORN, MD;  Location: [facility] ORS;  Service: Urology;  Laterality: Left;   FOOT SURGERY Right 04/13/2021   TONSILLECTOMY       Family History: Family History  Problem Relation Age of Onset   Heart disease Mother    Arthritis Mother    Heart disease Father    Cancer Father    Arthritis Father    Breast cancer Paternal Aunt        20's   Arthritis Maternal Grandmother    Diabetes Maternal Grandfather    Arthritis Maternal Grandfather     Social History:  reports that she has never smoked. She has never used smokeless tobacco. She reports current alcohol use. She reports that she does not use drugs.  Physical Exam:  Constitutional:  Alert and oriented, No acute distress. Cardiovascular: Regular rate and rhythm Respiratory: Clear to auscultation bilaterally GI: Abdomen is soft, nontender, nondistended, no abdominal masses   Laboratory Data: Urine culture 10/30/2023 with Enterobacter, treated with culture appropriate antibiotics  Assessment & Plan:   48 year old female with left ureteral stone and left renal stones, previously stented for UTI, here today for definitive management with ureteroscopy.  We specifically discussed the risks ureteroscopy  including bleeding, infection/sepsis, stent related symptoms including flank pain/urgency/frequency/incontinence/dysuria, ureteral injury, ureteral stricture, inability to access stone, or need for staged or additional procedures.  Left ureteroscopy, laser lithotripsy, stent change today  Redell Burnet, MD 11/21/2023  [facility] [address], [address] [place], [place] [address] [phone]

## 2023-11-21 NOTE — Anesthesia Preprocedure Evaluation (Signed)
 Anesthesia Evaluation  Patient identified by MRN, date of birth, ID band Patient awake    Reviewed: Allergy & Precautions, H&P , NPO status , Patient's Chart, lab work & pertinent test results, reviewed documented beta blocker date and time   Airway Mallampati: II  TM Distance: >3 FB Neck ROM: full    Dental  (+) Teeth Intact   Pulmonary neg pulmonary ROS   Pulmonary exam normal        Cardiovascular Exercise Tolerance: Good negative cardio ROS Atrial Fibrillation  Rhythm:regular Rate:Normal     Neuro/Psych  Headaches  Anxiety Depression     negative psych ROS   GI/Hepatic negative GI ROS, Neg liver ROS,,,  Endo/Other  negative endocrine ROS    Renal/GU Renal disease  negative genitourinary   Musculoskeletal   Abdominal   Peds  Hematology negative hematology ROS (+)   Anesthesia Other Findings Past Medical History: No date: A-fib [facility])     Comment:  in her 20's-no episodes since No date: Anxiety No date: Depression No date: Migraines No date: Pyelonephritis of left kidney No date: Renal disorder     Comment:  kidney failure at 6 Past Surgical History: No date: ABDOMINAL HYSTERECTOMY     Comment:  partial No date: BLADDER SUSPENSION 10/30/2023: CYSTOSCOPY WITH STENT PLACEMENT; Left     Comment:  Procedure: CYSTOSCOPY, WITH STENT INSERTION;  Surgeon:               Cam Morene ORN, MD;  Location: [facility] ORS;  Service:               Urology;  Laterality: Left; 04/13/2021: FOOT SURGERY; Right No date: TONSILLECTOMY BMI    Body Mass Index: 36.12 kg/m     Reproductive/Obstetrics negative OB ROS                              Anesthesia Physical Anesthesia Plan  ASA: 3  Anesthesia Plan: General ETT   Post-op Pain Management:    Induction:   PONV Risk Score and Plan: 4 or greater  Airway Management Planned:   Additional Equipment:   Intra-op Plan:   Post-operative  Plan:   Informed Consent: I have reviewed the patients History and Physical, chart, labs and discussed the procedure including the risks, benefits and alternatives for the proposed anesthesia with the patient or authorized representative who has indicated his/her understanding and acceptance.     Dental Advisory Given  Plan Discussed with: CRNA  Anesthesia Plan Comments:         Anesthesia Quick Evaluation

## 2023-11-21 NOTE — Op Note (Signed)
 Date of procedure: 11/21/23  Preoperative diagnosis:  Left distal ureteral stone Left renal stones  Postoperative diagnosis:  Left renal stones  Procedure: Cystoscopy, left ureteroscopy, laser lithotripsy of renal stones, left retrograde pyelogram with intraoperative interpretation, left ureteral stent placement  Surgeon: Redell Burnet, MD  Anesthesia: General  Complications: None  Intraoperative findings:  Normal bladder, no suspicious lesions No evidence of any ureteral stones Two ~84mm left renal stones dusted Uncomplicated stent placement with Dangler  EBL: Minimal  Specimens: None  Drains: Left 6 French by 24 cm ureteral stent with Dangler  Indication: Allison Sherman is a 48 y.o. patient with left ureteral stone and left renal stones previously stented for sepsis from urinary source, here today for definitive management with ureteroscopy.  After reviewing the management options for treatment, they elected to proceed with the above surgical procedure(s). We have discussed the potential benefits and risks of the procedure, side effects of the proposed treatment, the likelihood of the patient achieving the goals of the procedure, and any potential problems that might occur during the procedure or recuperation. Informed consent has been obtained.  Description of procedure:  The patient was taken to the operating room and general anesthesia was induced. SCDs were placed for DVT prophylaxis. The patient was placed in the dorsal lithotomy position, prepped and draped in the usual sterile fashion, and preoperative antibiotic were administered. A preoperative time-out was performed.   A 21 French rigid cystoscope was used to intubate the urethra and thorough cystoscopy was performed.  The bladder was grossly normal throughout, and ureteral orifices orthotopic bilaterally.  The left ureteral stent was grasped and pulled to the meatus, and a sensor wire used to intubate the stent and  passed up to the kidney under fluoroscopic vision, the old stent was removed.  A semirigid ureteroscope was advanced alongside the wire and there was no evidence of any ureteral stones up to the proximal ureter.  A digital single-channel flexible ureteroscope was then advanced over the wire up to the kidney under direct vision.  Thorough pyeloscopy revealed 2 approximately 4 mm stones in the midpole region.  A 200 m laser fiber on settings of 0.5 J and 80 Hz was used to methodically dust both stones.  No stone fragments  larger than the laser fiber remained.  A retrograde pyelogram was performed from the proximal ureter and showed no extravasation or filling defects.  A wire was replaced through the scope.  Pullback ureteroscopy showed no evidence of ureteral injury or residual fragments.  A 6 French by 24 cm ureteral stent was placed uneventfully under fluoroscopic vision with a curl in the upper pole as well as in the bladder.  The bladder was drained.  The Dangler was secured to the suprapubic region with Mastisol and Tegaderm.  Disposition: Stable to PACU  Plan: Remove stent at home on Friday morning  Redell Burnet, MD

## 2023-11-22 ENCOUNTER — Telehealth: Payer: Self-pay

## 2023-11-22 ENCOUNTER — Encounter: Payer: Self-pay | Admitting: Urology

## 2023-11-22 NOTE — Telephone Encounter (Signed)
 Pt called in with c/o of stent discomfort and wanted reassurance that this was normal. She states it feels different than her last stent. Pt states it feels like a tampon is positioned wrong. She states that nothing is hanging out other than the string. I stated this discomfort is normal and it could come and go at times. Pt understood. We did talk about using tylenol  or Ibuprofen  to help with her discomfort. Pt was agreeable to this. Pt was told to call us  back if things worsen.

## 2023-11-22 NOTE — Anesthesia Postprocedure Evaluation (Signed)
 Anesthesia Post Note  Patient: Allison Sherman  Procedure(s) Performed: CYSTOSCOPY/URETEROSCOPY/HOLMIUM LASER/STENT PLACEMENT (Left: Ureter)  Patient location during evaluation: PACU Anesthesia Type: General Level of consciousness: awake and alert Pain management: pain level controlled Vital Signs Assessment: post-procedure vital signs reviewed and stable Respiratory status: spontaneous breathing, nonlabored ventilation, respiratory function stable and patient connected to nasal cannula oxygen Cardiovascular status: blood pressure returned to baseline and stable Postop Assessment: no apparent nausea or vomiting Anesthetic complications: no   No notable events documented.   Last Vitals:  Vitals:   11/21/23 1230 11/21/23 1244  BP: 123/65 134/89  Pulse: 73 81  Resp: 15 16  Temp: 36.4 C 36.6 C  SpO2: 98% 99%    Last Pain:  Vitals:   11/21/23 1244  TempSrc: Temporal  PainSc: 4                  Lynwood KANDICE Clause
